# Patient Record
Sex: Male | Born: 1935 | Race: White | Hispanic: No | Marital: Married | Smoking: Never smoker
Health system: Southern US, Community
[De-identification: ages and names within clinical notes are randomized; demographics above are authoritative.]

## PROBLEM LIST (undated history)

## (undated) DIAGNOSIS — E538 Deficiency of other specified B group vitamins: Secondary | ICD-10-CM

## (undated) DIAGNOSIS — R5381 Other malaise: Secondary | ICD-10-CM

## (undated) DIAGNOSIS — M1990 Unspecified osteoarthritis, unspecified site: Secondary | ICD-10-CM

## (undated) DIAGNOSIS — K222 Esophageal obstruction: Secondary | ICD-10-CM

## (undated) DIAGNOSIS — K469 Unspecified abdominal hernia without obstruction or gangrene: Secondary | ICD-10-CM

## (undated) DIAGNOSIS — I6529 Occlusion and stenosis of unspecified carotid artery: Secondary | ICD-10-CM

## (undated) DIAGNOSIS — R6889 Other general symptoms and signs: Secondary | ICD-10-CM

## (undated) DIAGNOSIS — D649 Anemia, unspecified: Secondary | ICD-10-CM

## (undated) DIAGNOSIS — I10 Essential (primary) hypertension: Secondary | ICD-10-CM

## (undated) DIAGNOSIS — C61 Malignant neoplasm of prostate: Secondary | ICD-10-CM

## (undated) DIAGNOSIS — R42 Dizziness and giddiness: Secondary | ICD-10-CM

## (undated) DIAGNOSIS — R001 Bradycardia, unspecified: Secondary | ICD-10-CM

## (undated) DIAGNOSIS — K5790 Diverticulosis of intestine, part unspecified, without perforation or abscess without bleeding: Secondary | ICD-10-CM

## (undated) DIAGNOSIS — K279 Peptic ulcer, site unspecified, unspecified as acute or chronic, without hemorrhage or perforation: Secondary | ICD-10-CM

## (undated) DIAGNOSIS — R5383 Other fatigue: Secondary | ICD-10-CM

## (undated) HISTORY — DX: Esophageal obstruction: K22.2

## (undated) HISTORY — DX: Unspecified osteoarthritis, unspecified site: M19.90

## (undated) HISTORY — DX: Peptic ulcer, site unspecified, unspecified as acute or chronic, without hemorrhage or perforation: K27.9

## (undated) HISTORY — DX: Other general symptoms and signs: R68.89

## (undated) HISTORY — DX: Unspecified abdominal hernia without obstruction or gangrene: K46.9

## (undated) HISTORY — DX: Bradycardia, unspecified: R00.1

## (undated) HISTORY — DX: Diverticulosis of intestine, part unspecified, without perforation or abscess without bleeding: K57.90

## (undated) HISTORY — PX: SKIN SURGERY: SHX2413

## (undated) HISTORY — DX: Occlusion and stenosis of unspecified carotid artery: I65.29

## (undated) HISTORY — DX: Anemia, unspecified: D64.9

## (undated) HISTORY — DX: Deficiency of other specified B group vitamins: E53.8

## (undated) HISTORY — DX: Dizziness and giddiness: R42

## (undated) HISTORY — DX: Essential (primary) hypertension: I10

## (undated) HISTORY — DX: Malignant neoplasm of prostate: C61

## (undated) HISTORY — DX: Other fatigue: R53.83

## (undated) HISTORY — DX: Other malaise: R53.81

---

## 1998-07-17 ENCOUNTER — Encounter: Payer: Self-pay | Admitting: Gastroenterology

## 1998-07-17 ENCOUNTER — Ambulatory Visit (HOSPITAL_COMMUNITY): Admission: RE | Admit: 1998-07-17 | Discharge: 1998-07-17 | Payer: Self-pay | Admitting: Gastroenterology

## 1998-09-03 ENCOUNTER — Encounter (INDEPENDENT_AMBULATORY_CARE_PROVIDER_SITE_OTHER): Payer: Self-pay

## 1998-09-03 ENCOUNTER — Other Ambulatory Visit: Admission: RE | Admit: 1998-09-03 | Discharge: 1998-09-03 | Payer: Self-pay | Admitting: Gastroenterology

## 2001-04-12 ENCOUNTER — Inpatient Hospital Stay (HOSPITAL_COMMUNITY): Admission: EM | Admit: 2001-04-12 | Discharge: 2001-04-13 | Payer: Self-pay | Admitting: Emergency Medicine

## 2001-04-13 ENCOUNTER — Encounter: Payer: Self-pay | Admitting: Cardiology

## 2001-07-06 ENCOUNTER — Encounter: Payer: Self-pay | Admitting: Physical Medicine and Rehabilitation

## 2001-07-06 ENCOUNTER — Ambulatory Visit (HOSPITAL_COMMUNITY)
Admission: RE | Admit: 2001-07-06 | Discharge: 2001-07-06 | Payer: Self-pay | Admitting: Physical Medicine and Rehabilitation

## 2002-12-18 ENCOUNTER — Ambulatory Visit (HOSPITAL_COMMUNITY): Admission: RE | Admit: 2002-12-18 | Discharge: 2002-12-18 | Payer: Self-pay | Admitting: Urology

## 2003-01-08 ENCOUNTER — Ambulatory Visit: Admission: RE | Admit: 2003-01-08 | Discharge: 2003-02-01 | Payer: Self-pay | Admitting: Radiation Oncology

## 2003-02-10 HISTORY — PX: RETROPUBIC PROSTATECTOMY: SUR1055

## 2003-02-27 ENCOUNTER — Encounter (INDEPENDENT_AMBULATORY_CARE_PROVIDER_SITE_OTHER): Payer: Self-pay | Admitting: Specialist

## 2003-02-27 ENCOUNTER — Inpatient Hospital Stay (HOSPITAL_COMMUNITY): Admission: RE | Admit: 2003-02-27 | Discharge: 2003-03-03 | Payer: Self-pay | Admitting: Urology

## 2003-12-27 ENCOUNTER — Encounter: Admission: RE | Admit: 2003-12-27 | Discharge: 2004-02-09 | Payer: Self-pay | Admitting: Urology

## 2004-01-14 ENCOUNTER — Ambulatory Visit: Payer: Self-pay | Admitting: Family Medicine

## 2004-02-10 ENCOUNTER — Encounter: Admission: RE | Admit: 2004-02-10 | Discharge: 2004-05-10 | Payer: Self-pay | Admitting: Urology

## 2004-03-03 ENCOUNTER — Ambulatory Visit: Payer: Self-pay | Admitting: Family Medicine

## 2004-03-26 ENCOUNTER — Ambulatory Visit: Payer: Self-pay | Admitting: Family Medicine

## 2004-09-18 ENCOUNTER — Ambulatory Visit: Payer: Self-pay | Admitting: Family Medicine

## 2004-12-17 ENCOUNTER — Ambulatory Visit: Payer: Self-pay | Admitting: Family Medicine

## 2005-02-11 ENCOUNTER — Ambulatory Visit: Payer: Self-pay | Admitting: Family Medicine

## 2005-03-09 ENCOUNTER — Ambulatory Visit: Payer: Self-pay | Admitting: Family Medicine

## 2005-03-11 ENCOUNTER — Ambulatory Visit: Payer: Self-pay | Admitting: Gastroenterology

## 2005-03-30 ENCOUNTER — Ambulatory Visit: Payer: Self-pay | Admitting: Family Medicine

## 2005-04-06 ENCOUNTER — Ambulatory Visit: Payer: Self-pay | Admitting: Gastroenterology

## 2005-04-07 ENCOUNTER — Ambulatory Visit: Payer: Self-pay | Admitting: Gastroenterology

## 2005-04-07 ENCOUNTER — Encounter: Payer: Self-pay | Admitting: Internal Medicine

## 2005-04-08 ENCOUNTER — Ambulatory Visit: Payer: Self-pay | Admitting: Family Medicine

## 2005-06-10 ENCOUNTER — Ambulatory Visit: Payer: Self-pay | Admitting: Gastroenterology

## 2005-06-26 ENCOUNTER — Ambulatory Visit: Payer: Self-pay | Admitting: Gastroenterology

## 2005-07-01 ENCOUNTER — Ambulatory Visit: Payer: Self-pay | Admitting: Internal Medicine

## 2005-07-15 ENCOUNTER — Ambulatory Visit: Payer: Self-pay

## 2005-11-11 ENCOUNTER — Ambulatory Visit (HOSPITAL_COMMUNITY): Admission: RE | Admit: 2005-11-11 | Discharge: 2005-11-11 | Payer: Self-pay | Admitting: Urology

## 2006-03-15 ENCOUNTER — Ambulatory Visit (HOSPITAL_COMMUNITY): Admission: RE | Admit: 2006-03-15 | Discharge: 2006-03-15 | Payer: Self-pay | Admitting: Urology

## 2006-12-17 ENCOUNTER — Ambulatory Visit: Payer: Self-pay | Admitting: Internal Medicine

## 2006-12-31 ENCOUNTER — Ambulatory Visit: Payer: Self-pay | Admitting: Internal Medicine

## 2007-08-17 ENCOUNTER — Ambulatory Visit: Payer: Self-pay | Admitting: Internal Medicine

## 2007-08-30 ENCOUNTER — Encounter: Payer: Self-pay | Admitting: Internal Medicine

## 2007-08-30 ENCOUNTER — Ambulatory Visit: Payer: Self-pay

## 2007-09-09 ENCOUNTER — Ambulatory Visit: Payer: Self-pay | Admitting: Internal Medicine

## 2007-10-12 ENCOUNTER — Ambulatory Visit: Payer: Self-pay

## 2007-10-12 ENCOUNTER — Ambulatory Visit: Payer: Self-pay | Admitting: Internal Medicine

## 2007-11-24 ENCOUNTER — Ambulatory Visit: Payer: Self-pay | Admitting: Internal Medicine

## 2007-12-12 ENCOUNTER — Ambulatory Visit (HOSPITAL_COMMUNITY): Admission: RE | Admit: 2007-12-12 | Discharge: 2007-12-12 | Payer: Self-pay | Admitting: Internal Medicine

## 2007-12-12 ENCOUNTER — Ambulatory Visit: Payer: Self-pay | Admitting: Internal Medicine

## 2007-12-28 ENCOUNTER — Ambulatory Visit: Payer: Self-pay | Admitting: Internal Medicine

## 2008-06-25 ENCOUNTER — Encounter: Payer: Self-pay | Admitting: Internal Medicine

## 2008-06-25 ENCOUNTER — Telehealth (INDEPENDENT_AMBULATORY_CARE_PROVIDER_SITE_OTHER): Payer: Self-pay | Admitting: *Deleted

## 2008-07-17 ENCOUNTER — Telehealth: Payer: Self-pay | Admitting: Internal Medicine

## 2008-07-19 ENCOUNTER — Ambulatory Visit: Payer: Self-pay | Admitting: Internal Medicine

## 2008-07-19 DIAGNOSIS — I495 Sick sinus syndrome: Secondary | ICD-10-CM

## 2008-07-19 DIAGNOSIS — F419 Anxiety disorder, unspecified: Secondary | ICD-10-CM

## 2008-07-19 DIAGNOSIS — R5381 Other malaise: Secondary | ICD-10-CM

## 2008-07-19 DIAGNOSIS — Z8546 Personal history of malignant neoplasm of prostate: Secondary | ICD-10-CM

## 2008-07-19 HISTORY — DX: Other malaise: R53.81

## 2008-07-24 ENCOUNTER — Ambulatory Visit (HOSPITAL_COMMUNITY): Admission: RE | Admit: 2008-07-24 | Discharge: 2008-07-24 | Payer: Self-pay | Admitting: Internal Medicine

## 2008-07-25 ENCOUNTER — Telehealth (INDEPENDENT_AMBULATORY_CARE_PROVIDER_SITE_OTHER): Payer: Self-pay | Admitting: *Deleted

## 2009-01-08 ENCOUNTER — Ambulatory Visit: Payer: Self-pay | Admitting: Internal Medicine

## 2009-01-08 DIAGNOSIS — I10 Essential (primary) hypertension: Secondary | ICD-10-CM

## 2009-01-22 ENCOUNTER — Encounter: Payer: Self-pay | Admitting: Endocrinology

## 2009-01-22 LAB — CONVERTED CEMR LAB
Albumin: 4.1 g/dL
Basophils Relative: 0.1 %
CO2: 26 meq/L
Chloride: 105 meq/L
Creatinine, Ser: 1 mg/dL
Eosinophils Relative: 1.6 %
GFR calc Af Amer: >60 mL/min
GFR calc non Af Amer: >60 mL/min
Lymphocytes, automated: 22.4 %
Neutrophils Relative %: 72.2 %
Sodium: 137 meq/L
TSH: 0.85 microintl units/mL
Total Bilirubin: 0.4 mg/dL
WBC: 5.8 10*3/uL

## 2009-05-28 ENCOUNTER — Encounter: Payer: Self-pay | Admitting: Endocrinology

## 2009-05-28 LAB — CONVERTED CEMR LAB
AST: 20 units/L
Alkaline Phosphatase: 100 units/L
BUN: 29 mg/dL
Calcium: 9.3 mg/dL
Creatinine, Ser: 0.98 mg/dL
Eosinophils Relative: 2.6 %
Glucose, Bld: 73 mg/dL
Hgb A1c MFr Bld: 5.8 %
Neutrophils Relative %: 66.8 %
RBC: 4.4 M/uL
RDW: 10.4 %
Sodium: 142 meq/L

## 2009-07-01 ENCOUNTER — Encounter: Payer: Self-pay | Admitting: Internal Medicine

## 2009-07-25 ENCOUNTER — Ambulatory Visit: Payer: Self-pay | Admitting: Internal Medicine

## 2009-07-25 DIAGNOSIS — E785 Hyperlipidemia, unspecified: Secondary | ICD-10-CM | POA: Insufficient documentation

## 2009-08-28 ENCOUNTER — Telehealth: Payer: Self-pay | Admitting: Internal Medicine

## 2009-08-29 ENCOUNTER — Encounter: Payer: Self-pay | Admitting: Internal Medicine

## 2009-08-29 ENCOUNTER — Telehealth: Payer: Self-pay | Admitting: Internal Medicine

## 2009-09-05 ENCOUNTER — Encounter: Payer: Self-pay | Admitting: Internal Medicine

## 2009-10-01 ENCOUNTER — Encounter: Payer: Self-pay | Admitting: Endocrinology

## 2009-10-01 LAB — CONVERTED CEMR LAB
AST: 19 units/L
CO2: 25 meq/L
Calcium: 8.6 mg/dL
Chloride: 103 meq/L
Cholesterol: 202 mg/dL
HCT: 40.3 %
Hemoglobin: 13.8 g/dL
Platelets: 207 10*3/uL
Potassium: 4.5 meq/L
RBC: 4.42 M/uL
Sodium: 138 meq/L
TSH: 1.07 microintl units/mL
WBC: 5.68 10*3/uL

## 2009-10-07 ENCOUNTER — Encounter: Payer: Self-pay | Admitting: Internal Medicine

## 2009-10-10 ENCOUNTER — Ambulatory Visit: Payer: Self-pay | Admitting: Internal Medicine

## 2009-10-10 DIAGNOSIS — R079 Chest pain, unspecified: Secondary | ICD-10-CM

## 2009-10-15 ENCOUNTER — Telehealth (INDEPENDENT_AMBULATORY_CARE_PROVIDER_SITE_OTHER): Payer: Self-pay | Admitting: *Deleted

## 2009-10-16 ENCOUNTER — Ambulatory Visit: Payer: Self-pay | Admitting: Cardiovascular Disease

## 2009-10-16 ENCOUNTER — Encounter: Payer: Self-pay | Admitting: Cardiovascular Disease

## 2009-10-16 ENCOUNTER — Ambulatory Visit: Payer: Self-pay

## 2009-10-16 ENCOUNTER — Encounter (HOSPITAL_COMMUNITY): Admission: RE | Admit: 2009-10-16 | Discharge: 2009-12-10 | Payer: Self-pay | Admitting: Internal Medicine

## 2010-03-11 NOTE — Letter (Signed)
Summary: [facility] Medical Arrhythmia Clinic Note  [facility] Medical Arrhythmia Clinic Note   Imported By: Roderic Ovens 07/29/2009 14:38:55  _____________________________________________________________________  External Attachment:    Type:   Image     Comment:   External Document

## 2010-03-11 NOTE — Assessment & Plan Note (Signed)
Summary: Cardiology Nuclear Testing  Nuclear Med Background Indications for Stress Test: Evaluation for Ischemia   History: Echo, Myocardial Perfusion Study  History Comments: '09 [id number]  Symptoms: Chest Pain, Dizziness, Fatigue, Near Syncope, Palpitations, Rapid HR  Symptoms Comments: Last episode of UU:[id number].   Nuclear Pre-Procedure Cardiac Risk Factors: Family History - CAD, Hypertension, Lipids, NIDDM Caffeine/Decaff Intake: None NPO After: 11:00 PM Lungs: Clear.  O2 Sat 99% on RA. IV 0.9% NS with Angio Cath: 22g     IV Site: R Antecubital IV Started by: Bonnita Levan, RN Chest Size (in) 38     Height (in): 67 Weight (lb): 136 BMI: 21.38  Nuclear Med Study 1 or 2 day study:  1 day     Stress Test Type:  Eugenie Birks Reading MD:  Charlton Haws, MD     Referring MD:  Sherryl Manges, MD Resting Radionuclide:  Technetium 9m Tetrofosmin     Resting Radionuclide Dose:  11.0 mCi  Stress Radionuclide:  Technetium 23m Tetrofosmin     Stress Radionuclide Dose:  32.6 mCi   Stress Protocol      Predicted Max HR:  144 bpm     Stress Test Technologist:  Rea College, CMA-N     Nuclear Technologist:  Domenic Polite, CNMT  Rest Procedure  Myocardial perfusion imaging was performed at rest 45 minutes following the intravenous administration of Technetium 65m Tetrofosmin.  Stress Procedure  The patient received IV Lexiscan 0.4 mg over 15-seconds.  Technetium 73m Tetrofosmin injected at 30-seconds.  There was an accelerated junctional rhythm immediately post infusion.  Quantitative spect images were obtained after a 45 minute delay.  QPS Raw Data Images:  Normal; no motion artifact; normal heart/lung ratio. Stress Images:  Normal homogeneous uptake in all areas of the myocardium. Rest Images:  Normal homogeneous uptake in all areas of the myocardium. Subtraction (SDS):  SDS 4 Transient Ischemic Dilatation:  1.14  (Normal <1.22)  Lung/Heart Ratio:  0.28  (Normal  <0.45)  Quantitative Gated Spect Images QGS EDV:  74 ml QGS ESV:  32 ml QGS EF:  57 % QGS cine images:  normal  Findings Normal nuclear study      Overall Impression  Exercise Capacity: Lexiscan with no exercise. BP Response: Normal blood pressure response. Clinical Symptoms: Dysnpnea ECG Impression: No significant ST segment change suggestive of ischemia. Overall Impression: Normal stress nuclear study.  Appended Document: Cardiology Nuclear Testing LMTCB./CY    Appended Document: Cardiology Nuclear Testing pt aware./cy

## 2010-03-11 NOTE — Assessment & Plan Note (Signed)
Summary: problem with bp meds/mt   CC:  problem with bp meds.  Pt stil feeling very low in energy.  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     The patient has elected not to do anything, in terms of pacing  comes in for routine followup today. He has a multitude of complaints including back pain and neck pain and chest pain and fatigue. It is partly his impression that this is related to his blood pressure medications which are recently initiated. He tried an extremity decreased in half and thought that his symptoms improved somewhat.  he brings with him laboratories. His total cholesterol was 202 his LDL is 111 hemogram was normal the blood profile was normal apart from mildly elevated BUN his ABI was normal his aortic abdominal scan was normal his carotid artery findings were apparently also normal    Current Medications (verified): 1)  Aspirin 81 Mg Tbec (Aspirin) .... Take One Tablet By Mouth Daily 2)  Otrivin .... Nasal Spray As Needed-Bid 3)  Alprazolam 0.25 Mg Tabs (Alprazolam) .... Take One Tablet Two Times A Day 4)  Tekturna 150 Mg Tabs (Aliskiren Fumarate) .... Once Daily 5)  Fish Oil 1000 Mg Caps (Omega-3 Fatty Acids) .... Take One Capsule Two Times A Day 6)  Coq-10 100 Mg Caps (Coenzyme Q10) .... Once Daily 7)  Cyanocobalamin 1000 Mcg/ml Soln (Cyanocobalamin) .... One Injection Monthly 8)  Prilosec 20 Mg Cpdr (Omeprazole) .... Take One Capsule Every Morning Befor Breakfast 9)  Metamucil 0.52 Gm Caps (Psyllium) .... Take 5 Capsules Daily  Allergies (verified): 1)  Sulfamethoxazole (Sulfamethoxazole)  Past History:  Past Medical History: Last updated: 07/19/2008 Angina phlebitis Bradycardia Exercise Intolerance  Past Surgical History: Last updated: 07/19/2008  Radical retropubic  prostatectomy-2005  Family History: Last updated: 01/08/2009 Negative FH of Diabetes, Hypertension, or Coronary Artery Disease  Social History: Last updated: 07/19/2008 Married  Tobacco Use - No.  Alcohol Use - yes Drug Use - no  Vital Signs:  Patient profile:   77 year old male Height:      67 inches Weight:      135 pounds BMI:     21.22 Pulse rate:   58 / minute Pulse rhythm:   regular BP sitting:   120 / 60  (right arm) Cuff size:   regular  Vitals Entered By: Judithe Modest CMA (October 10, 2009 9:55 AM)  Physical Exam  General:  /The patient was alert and oriented in no acute distress. HEENT Normal.  Neck veins were flat, carotids were brisk.  Lungs were clear.  Heart sounds were slow but  regular without murmurs or gallops.  Abdomen was soft with active bowel sounds. There is no clubbing cyanosis or edema. Skin Warm and dry    EKG  Procedure date:  07/22/2009  Findings:      sinus rhythm at 48 Intervals 0.16 5.08/0.40  Impression & Recommendations:  Problem # 1:  SINUS BRADYCARDIA (ICD-427.81) the patient continues to have sinus bradycardia. He also has fatigue which may be attributable.  He also wonders whether his fatigue and other symptoms are related to his blood pressure medications. We will plan to discontinue the blood pressmedications and see how his symptoms fare. In the event that they persist, he is willing to consider pacemaker implantation His updated medication list for this problem includes:  Aspirin 81 Mg Tbec (Aspirin) .Marland Kitchen... Take one tablet by mouth daily  Problem # 2:  HYPERTENSION, BENIGN (ICD-401.1) plan to stop his tekturna for his blood pressure. Hopefully we will see his symptoms are attributable to this medication or not His updated medication list for this problem includes:    Aspirin 81 Mg Tbec (Aspirin) .Marland Kitchen... Take one tablet by mouth daily    Tekturna 150 Mg Tabs (Aliskiren fumarate) ..... Once daily  Problem # 3:  CHEST  PAIN (ICD-786.50)  the patient has chest pain which is somewhat atypical. He has not had Myoview scan in the past. He has had stress testing with which he is chronically incontinent. Will plan to undertake Myoview scanning. His updated medication list for this problem includes:    Aspirin 81 Mg Tbec (Aspirin) .Marland Kitchen... Take one tablet by mouth daily  Orders: Nuclear Stress Test (Nuc Stress Test)  Patient Instructions: 1)  Your physician recommends that you schedule a follow-up appointment in: YEAR WITH DR Graciela Husbands 2)  Your physician has recommended you make the following change in your medication: STOP TEKTURNA 3)  Your physician has requested that you have an adenosine myoview.  For further information please visit [email or web address]  Please follow instruction sheet, as given.

## 2010-03-11 NOTE — Progress Notes (Signed)
Summary: nuc pre procedure  Phone Note Outgoing Call Call back at Home Phone [phone]   Call placed by: Darrick Penna Call placed to: Patient Reason for Call: Confirm/change Appt Summary of Call: Reviewed information on Myoview Information Sheet (see scanned document for further details). No answer at phone #'s listed.      Nuclear Med Background Indications for Stress Test: Evaluation for Ischemia   History: Echo  History Comments: 09 Echo nl 09 MPS nl  Symptoms: Chest Pain, Fatigue    Nuclear Pre-Procedure Cardiac Risk Factors: Family History - CAD, Hypertension, Lipids Height (in): 67

## 2010-03-11 NOTE — Progress Notes (Signed)
Summary: REd Blood in stools  Phone Note Call from Patient   Caller: Scarlette Calico [phone] Call For: Dr Leone Payor Summary of Call: Just now- when to bathroom and saw red blood with his stools Initial call taken by: Leanor Kail [facility],  August 28, 2009 12:38 PM  Follow-up for Phone Call        patient went to his primary care, they are not sure of the diagnosis.  Patient  is scheduled to see Dr Meisinheimer tomorrow scheduled with his primary care.  She thanked me for the call. Follow-up by: Darcey Nora RN, CGRN,  August 28, 2009 2:32 PM

## 2010-03-11 NOTE — Assessment & Plan Note (Signed)
Summary: 6 month rov./sl   CC:  6 month follow up. Pt reports doing not so good.  He states he has felt burned out and weak.  He is now pre-diabetic and is on a special diet.  Marland Kitchen  History of Present Illness: Alan [name] is seen in followup for peripheral incompetence. Since we last saw him last fall, he has again seen Alan [name] whose initial impression was in agreement with the diagnosis of chronotropic incompetence.  We elected not to do anything, and he saw  Alan [name] at  [facility] again last week and apparently felt that at this juncture we should do nothing unless he had specific associated symptoms.  The patient admits to being depressed and has refused to take antidepressants. He is recently been diagnosed with diabetes and high blood pressure. He has been disinclined to take medications for any of this.   Is a family history of abdominal aortic aneurysm; he has been following this on his own in the [employer]   Current Medications (verified): 1)  Aspirin 81 Mg Tbec (Aspirin) .... Take One Tablet By Mouth Daily 2)  Otrivin .... Nasal Spray As Needed 3)  Alprazolam 0.5 Mg Tabs (Alprazolam) .... Take One Tablet Two Times A Day 4)  Tekturna 150 Mg Tabs (Aliskiren Fumarate) .... Once Daily 5)  Fish Oil 1000 Mg Caps (Omega-3 Fatty Acids) .... Once Daily 6)  Coq-10 100 Mg Caps (Coenzyme Q10) .... Once Daily  Allergies (verified): 1)  Sulfamethoxazole (Sulfamethoxazole)  Past History:  Past Medical History: Last updated: 07/19/2008 Angina phlebitis Bradycardia Exercise Intolerance  Past Surgical History: Last updated: 07/19/2008  Radical retropubic prostatectomy-2005  Family History: Last updated: 01/08/2009 Negative FH of Diabetes, Hypertension, or Coronary Artery Disease  Social History: Last updated: 07/19/2008 Married  Tobacco Use - No.  Alcohol Use - yes Drug Use - no  Vital Signs:  Patient profile:   77 year old male Height:      67 inches Weight:       135 pounds BMI:     21.22 Pulse rate:   58 / minute Pulse rhythm:   regular BP sitting:   118 / 72  (left arm) Cuff size:   regular  Vitals Entered By: Judithe Modest CMA (July 25, 2009 4:04 PM)  Physical Exam  General:  The patient was alert and oriented in no acute distress. HEENT Normal.  Neck veins were flat, carotids were brisk.  Lungs were clear.  Heart sounds were regular without murmurs or gallops.  Abdomen was soft with active bowel sounds. There is no clubbing cyanosis or edema. Skin Warm and dry    Impression & Recommendations:  Problem # 1:  SINUS BRADYCARDIA (ICD-427.81) I spoke with Alan [name] today and spent more than 50% of our 32 minute visit discussing issues related to chronotropic incompetence his measurements and its therapy. At this point the patient remains disinclined to pursue pacemaker therapy. He is not sure whether the depression is contributing more to his fatigue. He will plan to take his antidepressant and we will review it again in about 6 months time His updated medication list for this problem includes:    Aspirin 81 Mg Tbec (Aspirin) .Marland Kitchen... Take one tablet by mouth daily  Orders: EKG w/ Interpretation (93000)  Problem # 2:  HYPERLIPIDEMIA (ICD-272.4) the patient brought with him reports of his LDL 150 range over the last 10 years. He was recently started on Crestor. He has been reluctant to take this.  I suggested that he start at 2 times a week.  Patient Instructions: 1)  Your physician wants you to follow-up in:6 months with Alan [name].   You will receive a reminder letter in the mail two months in advance. If you don't receive a letter, please call our office to schedule the follow-up appointment.  Appended Document: [facility]     Allergies: 1)  Sulfamethoxazole (Sulfamethoxazole)   EKG  Procedure date:  07/25/2009  Findings:      sinus rhythm at 58 Intervals 0.12/0.08/0.38 Axis -12 Otherwise normal  Impression &  Recommendations:  Problem # 1:  SINUS BRADYCARDIA (ICD-427.81) I should note also that we reviewed Alan. Dwaine Deter office records His updated medication list for this problem includes:    Aspirin 81 Mg Tbec (Aspirin) .Marland Kitchen... Take one tablet by mouth daily

## 2010-03-11 NOTE — Progress Notes (Signed)
Summary: Bright red blood w/stool  Phone Note Call from Patient Call back at Home Phone [phone]   Caller: Alan [name]  Call For: Dr Leone Payor Summary of Call: Just had a bm and is bleeding like a period.  Initial call taken by: Leanor Kail [facility],  August 29, 2009 1:11 PM  Follow-up for Phone Call        I spoke with the wife, he has had no bleeding today.  She says the  [facility] misunderstood.  Patient was seen by Dr Meisinheimer in [place] today, they want to see Dr Leone Payor.  Meisinheimer recommended a colon.  No blood on exam today.  No bleeding today.  Dr Leone Payor are you willing to see patient? Follow-up by: Darcey Nora RN, CGRN,  August 29, 2009 1:55 PM  Additional Follow-up for Phone Call Additional follow up Details #1::        do we have records? - especially prior colonoscopies...will discuss also Iva Boop MD, [facility]  September 03, 2009 7:20 AM  I called and spoke with the patient's wife, they have just decided to stay with Dr Meisinheimer.   Additional Follow-up by: Darcey Nora RN, CGRN,  September 03, 2009 9:36 AM    Additional Follow-up for Phone Call Additional follow up Details #2::    ok Follow-up by: Iva Boop MD, [facility],  September 03, 2009 9:50 AM

## 2010-03-17 ENCOUNTER — Encounter: Payer: Self-pay | Admitting: Endocrinology

## 2010-03-17 LAB — CONVERTED CEMR LAB: Hgb A1c MFr Bld: 5.8 %

## 2010-03-18 ENCOUNTER — Telehealth: Payer: Self-pay | Admitting: Internal Medicine

## 2010-03-20 ENCOUNTER — Encounter: Payer: Self-pay | Admitting: Internal Medicine

## 2010-03-25 ENCOUNTER — Encounter: Payer: Self-pay | Admitting: Internal Medicine

## 2010-03-25 ENCOUNTER — Ambulatory Visit (INDEPENDENT_AMBULATORY_CARE_PROVIDER_SITE_OTHER): Payer: Medicare Other | Admitting: Internal Medicine

## 2010-03-25 ENCOUNTER — Telehealth: Payer: Self-pay | Admitting: Internal Medicine

## 2010-03-25 DIAGNOSIS — R1319 Other dysphagia: Secondary | ICD-10-CM | POA: Insufficient documentation

## 2010-03-25 DIAGNOSIS — R12 Heartburn: Secondary | ICD-10-CM | POA: Insufficient documentation

## 2010-03-25 DIAGNOSIS — K222 Esophageal obstruction: Secondary | ICD-10-CM | POA: Insufficient documentation

## 2010-03-26 DIAGNOSIS — Z860100 Personal history of colon polyps, unspecified: Secondary | ICD-10-CM | POA: Insufficient documentation

## 2010-03-27 NOTE — Progress Notes (Signed)
Summary: Appt sooner than first available  Phone Note From Other Clinic   Caller: Fannie Knee @ Dr Tomasa Blase [phone] Call For: Dr Leone Payor Reason for Call: Schedule Patient Appt Summary of Call: Would like pt seen sooner tahn first available for dysphagia. Former pt of Dr Doreatha Martin, then had a Colon by Dr Leone Payor 12-2006 has not had anything else since. Initial call taken by: Leanor Kail [facility],  March 18, 2010 9:33 AM  Follow-up for Phone Call        Patient transfered care to Dr Meisinheimer's office in July see phone notes dated 08/29/09.  Fannie Knee notified that patient needs to return to Dr Meisinheimer. Follow-up by: Darcey Nora RN, CGRN,  March 18, 2010 10:01 AM

## 2010-04-02 ENCOUNTER — Encounter: Payer: Self-pay | Admitting: Endocrinology

## 2010-04-02 NOTE — Letter (Signed)
Summary: EGD Instructions  [facility]  [address] [place], [place] [address]   Phone: [phone]  Fax: [phone]       Alan [name]    13-Oct-1931    MRN: [id number]       Procedure Day /Date:TUESDAY, 04/15/10     Arrival Time: 2:30 PM     Procedure Time:3:30 PM     Location of Procedure:                    [facility] (4th Floor)   PREPARATION FOR ENDOSCOPY   On TUESDAY, 04/15/10 THE DAY OF THE PROCEDURE:  1.   No solid foods, milk or milk products are allowed after midnight the night before your procedure.  2.   Do not drink anything colored red or purple.  Avoid juices with pulp.  No orange juice.  3.  You may drink clear liquids until1:30 PM, which is 2 hours before your procedure.                                                                                                CLEAR LIQUIDS INCLUDE: Water Jello Ice Popsicles Tea (sugar ok, no milk/cream) Powdered fruit flavored drinks Coffee (sugar ok, no milk/cream) Gatorade Juice: apple, white grape, white cranberry  Lemonade Clear bullion, consomm, broth Carbonated beverages (any kind) Strained chicken noodle soup Hard Candy   MEDICATION INSTRUCTIONS  Unless otherwise instructed, you should take regular prescription medications with a small sip of water as early as possible the morning of your procedure.         OTHER INSTRUCTIONS  You will need a responsible adult at least 75 years of age to accompany you and drive you home.   This person must remain in the waiting room during your procedure.  Wear loose fitting clothing that is easily removed.  Leave jewelry and other valuables at home.  However, you may wish to bring a book to read or an iPod/MP3 player to listen to music as you wait for your procedure to start.  Remove all body piercing jewelry and leave at home.  Total time from sign-in until discharge is approximately 2-3 hours.  You should go home directly  after your procedure and rest.  You can resume normal activities the day after your procedure.  The day of your procedure you should not:   Drive   Make legal decisions   Operate machinery   Drink alcohol   Return to work  You will receive specific instructions about eating, activities and medications before you leave.    The above instructions have been reviewed and explained to me by   _______________________    I fully understand and can verbalize these instructions _____________________________ Date _________

## 2010-04-02 NOTE — Progress Notes (Signed)
Summary: needs appt for dysphagia  Phone Note Outgoing Call   Summary of Call: Please call the patient and let him know I received his letter and will be happy to see him as a patient. Please see if I can see him this week or next week and also obtain hispaper chart for me. Iva Boop MD, [facility]  March 25, 2010 7:52 AM   Follow-up for Phone Call        Patient will see Dr Leone Payor today at 3:15 per Dr Leone Payor Follow-up by: Graciella Freer RN,  March 25, 2010 9:45 AM      Colonoscopy  Procedure date:  09/05/2009  Findings:      Mild diverticulosis left colon Internal Hemorrhoids  Dr. Jennye Boroughs

## 2010-04-02 NOTE — Procedures (Signed)
Summary: EGD   EGD  Procedure date:  04/07/2005  Findings:      Location: [facility]  Hiatal Hernia  Patient Name: Alan [name], Alan [name] MRN:  Procedure Procedures: Panendoscopy (EGD) CPT: 43235.  Personnel: Endoscopist: Ulyess Mort, MD.  Referred By: Lise Auer, MD.  Exam Location: Exam performed in Outpatient Clinic. Outpatient  Patient Consent: Procedure, Alternatives, Risks and Benefits discussed, consent obtained, from patient. Consent was obtained by the RN.  Indications Symptoms: Dysphagia. Dyspepsia, Reflux symptoms  History  Current Medications: Patient is not currently taking Coumadin.  Pre-Exam Physical: Performed Sep 30, 2001  Entire physical exam was normal. Abdominal exam, Extremity exam, Mental status exam WNL.  Comments: Pt. history reviewed/updated, physical exam performed prior to initiation of sedation? Exam Exam Info: Maximum depth of insertion Duodenum, intended Duodenum. Patient position: on left side. Vocal cords visualized. Gastric retroflexion performed. Images taken. ASA Classification: II. Tolerance: good.  Sedation Meds: Patient assessed and found to be appropriate for moderate (conscious) sedation. Fentanyl given IV. Versed given IV. Cetacaine Spray given aerosolized.  Monitoring: BP and pulse monitoring done. Oximetry used. Supplemental O2 given  Findings - HIATAL HERNIA: 3 cms. in length. early stricture with increased spasm of esopagous   -- decreased ues. ICD9: Hernia, Hiatal: 553.3. - Dilation: Fundus. Maloney dilator used, Diameter: 54 mm, Minimal Resistance, No Heme present on extraction. Patient tolerance good.  - Normal: Fundus to Jejunum.   Assessment  Diagnoses: 553.3: Hernia, Hiatal.   Events  Unplanned Intervention: No unplanned interventions were required.  Unplanned Events: There were no complications. Plans Disposition: After procedure patient sent to recovery. After recovery patient  sent home.    This report was created from the original endoscopy report, which was reviewed and signed by the above listed endoscopist.

## 2010-04-02 NOTE — Assessment & Plan Note (Signed)
Summary: Triage Dysphagia   History of Present Illness Visit Type: Follow-up Visit Primary GI MD: Stan Head MD [facility] Primary Provider: Laverna Peace Requesting Provider: n/a Chief Complaint: Dysphagia and heartburn for a couple of months, uses Zantac and Prilosec and does not help. Pt states when he chews sugarless gum it help releave his symptoms History of Present Illness:   77 yo wm with known GERD and previous esophageal dilations for early stricture and spasm (2/07 - Dr. Corinda Gubler. He describes increasing indigestion and food moves slowly down his throat. He has tried Prilosec and Zantac without benefit. He has probles for at least 2-3 months. The food will stop or hang his chest at times. Pills also at times. Peanut butter on toast will stop. He usually waits and things pass. Pills are a problem aout every other day Food a problem about every 2-3 days. Chewing gum helps with initiating belching and relief of gas pressure. He will do this at night sometimes. he is chronically anxious he says and admts that could be related. Weight down 10 # since 2010. He has been eating differently (prediabetic) but says he lost prior to staring that.  Wife here and contributes to history.   GI Review of Systems    Reports acid reflux, chest pain, heartburn, and  weight loss.      Denies abdominal pain, belching, bloating, dysphagia with liquids, dysphagia with solids, loss of appetite, nausea, vomiting, vomiting blood, and  weight gain.        Denies anal fissure, black tarry stools, change in bowel habit, constipation, diarrhea, diverticulosis, fecal incontinence, heme positive stool, hemorrhoids, irritable bowel syndrome, jaundice, light color stool, liver problems, rectal bleeding, and  rectal pain. Preventive Screening-Counseling & Management      Drug Use:  no.      Current Medications (verified): 1)  Aspirin 81 Mg Tbec (Aspirin) .... Take One Tablet By Mouth Daily 2)  Otrivin ....  Nasal Spray As Needed-Bid 3)  Alprazolam 0.25 Mg Tabs (Alprazolam) .... Take One Tablet Three  Times A Day 4)  Fish Oil 1000 Mg Caps (Omega-3 Fatty Acids) .... Take One Capsule Two Times A Day 5)  Coq-10 100 Mg Caps (Coenzyme Q10) .Marland Kitchen.. 1 By Mouth Two Times A Day 6)  Cyanocobalamin 1000 Mcg/ml Soln (Cyanocobalamin) .... One Injection Monthly 7)  Prilosec 20 Mg Cpdr (Omeprazole) .... Take One Capsule Every Morning Befor Breakfast 8)  Metamucil 0.52 Gm Caps (Psyllium) .... Take 5 Capsules Daily As Needed 9)  B-100 Complex  Tabs (Vitamins-Lipotropics) .Marland Kitchen.. 1 By Mouth Once Daily 10)  Zantac 150 Mg Tabs (Ranitidine Hcl) .Marland Kitchen.. 1 By Mouth Once Daily  Allergies (verified): 1)  ! 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  Alcohol Use - no Drug Use - no Illicit Drug Use - no  Review of Systems       The patient complains of allergy/sinus.         All other ROS negative except as per HPI.   Vital  Signs:  Patient profile:   77 year old male Height:      67 inches Weight:      132 pounds BMI:     20.75 BSA:     1.70 Pulse rate:   64 / minute Pulse rhythm:   regular BP sitting:   104 / 58  (left arm)  Vitals Entered By: Merri Ray CMA Duncan Dull) (March 25, 2010 3:22 PM)  Physical Exam  General:  thin NAD Eyes:  anicteric Mouth:  clear Neck:   Supple; mildly enlarged left submandibular salivary gland. Lungs:  Clear throughout to auscultation. Heart:  Regular rate and rhythm; no murmurs, rubs,  or bruits. Abdomen:  Soft, nontender and nondistended. No masses, hepatosplenomegaly or hernias noted. Normal bowel sounds. Cervical Nodes:  No significant cervical or supraclavicular adenopathy.  Psych:  mildly anxious   Impression & Recommendations:  Problem # 1:  OTHER DYSPHAGIA (ICD-787.29) Assessment Deteriorated Has had an "early stricture" descrinbed and spasm. Responded well and long time to [address] [employer] in 2007 so plan to reattempt that. ? component of anxiety/functional disturbance Risks, benefits,and indications of endoscopic procedure(s) were reviewed with the patient and all questions answered.  Orders: EGD SAV (EGD SAV)  Problem # 2:  ESOPHAGEAL STRICTURE (ICD-530.3) Assessment: Deteriorated  Orders: EGD SAV (EGD SAV)  Problem # 3:  HEARTBURN (ICD-787.1) Assessment: Deteriorated ? GERD ? functional heartburn he is anxious Nexium samples provided Orders: EGD SAV (EGD SAV)  Problem # 4:  TUBULOVILLOUS ADENOMA, COLON, HX OF (ICD-V12.72) Assessment: Comment Only 7 mm TV adenoma in 2000 no pollyps 2003, 2008, 2011  I would not place a routine recall given his age (next routine colonoscopy if done would be age 64 and in 2016  Patient Instructions: 1)  EGD with [facility]. 04/15/10 3:30 pm arrive at 2:30 pm on the 4th floor. 2)  Upper Endoscopy brochure given.  3)  Nexium samples given to you to take 1 by mouth 30 minutes prior to breakfast or first meal.   4)  Copy sent to : Doug Schultz,MD 5)  The medication list was reviewed and reconciled.  All changed / newly prescribed medications were explained.  A complete medication list was provided to the patient / caregiver.

## 2010-04-02 NOTE — Procedures (Signed)
Summary: colonoscopy   Colonoscopy  Procedure date:  12/31/2006  Findings:      Location:  [facility].  Results: Hemorrhoids.       Patient Name: Alan [name], Alan [name] MRN:  Procedure Procedures: Colorectal cancer screening, high risk CPT: G0105.  Personnel: Endoscopist: Iva Boop, MD, [facility].  Exam Location: Exam performed in Outpatient Clinic. Outpatient  Patient Consent: Procedure, Alternatives, Risks and Benefits discussed, consent obtained, from patient. Consent was obtained by the RN.  Indications  Surveillance of: Adenomatous Polyp(s). This is an initial surveillance exam. Initial polypectomy was performed in 2000. in Jul. 1-2 Polyps were found at Index Exam. Largest polyp removed was 6 to 9 mm. Prior polyp located in proximal (splenic flexure and beyond) colon. Pathology of worst  polyp: tubulovillous adenoma. Previous surveillance exam(s) in  2003,  History  Current Medications: Patient is not currently taking Coumadin.  Allergies: Allergic to sulfa.  Pre-Exam Physical: Performed Dec 31, 2006. Cardio-pulmonary exam, Rectal exam, HEENT exam , Abdominal exam, Mental status exam WNL.  Comments: Pt. history reviewed/updated, physical exam performed prior to initiation of sedation? YES Prostate is absent Exam Exam: Extent of exam reached: Cecum, extent intended: Cecum.  The cecum was identified by appendiceal orifice and IC valve. Patient position: on left side. Time to Cecum: 00:03:02. Time for Withdrawl: 00:09:08. Colon retroflexion performed. Images taken. ASA Classification: II. Tolerance: excellent.  Monitoring: Pulse and BP monitoring, Oximetry used. Supplemental O2 given.  Colon Prep Used MiraLax for colon prep. Prep results: excellent.  Sedation Meds: Patient assessed and found to be appropriate for moderate (conscious) sedation. Fentanyl 50 mcg. given IV. Versed 5 mg. given IV.  Findings - NORMAL EXAM: Cecum to Sigmoid Colon.    HEMORRHOIDS: Internal and External. Size: Grade I.    Comments: Diverticulosis not seen today  Assessment  Comments: 1) NO POLYPS OR CANCER SEEN 2) INT/EXT HEMORRHOIDS 3) OTHERWISE NORMAL 4) EXCELLENT PREP Events  Unplanned Interventions: No intervention was required.  Plans Patient Education: Patient given standard instructions for: Hemorrhoids.  Disposition: After procedure patient sent to recovery. After recovery patient sent home.  Scheduling/Referral: Follow-Up prn.  Comments: Consider repeat colonoscopy in 5 yrs depending upon health status. Has not had any polyps over 8 yrs since initial and only polypectomy.  This report was created from the original endoscopy report, which was reviewed and signed by the above listed endoscopist.   cc:  Lise Auer, MD      The Patient

## 2010-04-04 ENCOUNTER — Ambulatory Visit (INDEPENDENT_AMBULATORY_CARE_PROVIDER_SITE_OTHER): Payer: Medicare Other | Admitting: Endocrinology

## 2010-04-04 ENCOUNTER — Telehealth: Payer: Self-pay | Admitting: Endocrinology

## 2010-04-04 ENCOUNTER — Encounter: Payer: Self-pay | Admitting: Endocrinology

## 2010-04-04 DIAGNOSIS — R7309 Other abnormal glucose: Secondary | ICD-10-CM

## 2010-04-04 DIAGNOSIS — L719 Rosacea, unspecified: Secondary | ICD-10-CM | POA: Insufficient documentation

## 2010-04-08 NOTE — Procedures (Signed)
Summary: Colonoscopy/[facility]  Colonoscopy/[facility]   Imported By: Sherian Rein 03/31/2010 10:20:14  _____________________________________________________________________  External Attachment:    Type:   Image     Comment:   External Document

## 2010-04-08 NOTE — Letter (Signed)
Summary: Laurell Roof MD/[facility]  Laurell Roof MD/[facility]   Imported By: Sherian Rein 03/31/2010 10:15:30  _____________________________________________________________________  External Attachment:    Type:   Image     Comment:   External Document

## 2010-04-08 NOTE — Letter (Signed)
Summary: Laurell Roof MD/[facility]  Laurell Roof MD/[facility]   Imported By: Sherian Rein 03/31/2010 10:17:08  _____________________________________________________________________  External Attachment:    Type:   Image     Comment:   External Document

## 2010-04-08 NOTE — Letter (Signed)
Summary: Letter from patient  Letter from patient   Imported By: Sherian Rein 03/31/2010 10:18:48  _____________________________________________________________________  External Attachment:    Type:   Image     Comment:   External Document

## 2010-04-08 NOTE — Progress Notes (Signed)
Summary: ?diet  Phone Note Call from Patient   Caller: Patient Summary of Call: Pt states that he has been following a strict modified carbohydrate diet from a dietician for over a year and wants to know he can deviate a little from it from time to time. Initial call taken by: Brenton Grills CMA Duncan Dull),  April 04, 2010 11:22 AM  Follow-up for Phone Call        your weight is good, so the answer is yes Follow-up by: Minus Breeding MD,  April 04, 2010 11:28 AM  Additional Follow-up for Phone Call Additional follow up Details #1::        left message for pt to callback office Additional Follow-up by: Brenton Grills CMA Duncan Dull),  April 04, 2010 1:07 PM    Additional Follow-up for Phone Call Additional follow up Details #2::    pt informed Follow-up by: Brenton Grills CMA Duncan Dull),  April 04, 2010 2:59 PM

## 2010-04-08 NOTE — Assessment & Plan Note (Signed)
Summary: NEW ENDO / DM / MEDICARE / [employer] /DR DOUGLAS SHULTZ / /[phone]...   Vital Signs:  Patient profile:   77 year old male Height:      67 inches (170.18 cm) Weight:      134 pounds (60.91 kg) BMI:     21.06 O2 Sat:      97 % on Room air Temp:     98.2 degrees F (36.78 degrees C) oral Pulse rate:   53 / minute Pulse rhythm:   regular BP sitting:   108 / 60  (left arm) Cuff size:   regular  Vitals Entered By: Brenton Grills CMA Duncan Dull) (April 04, 2010 10:05 AM)  O2 Flow:  Room air CC: New Endo Consult/DM/Dr. Riley Lam Schultz/aj Is Patient Diabetic? Yes   Referring Provider:  Foye Deer MD Primary Provider:  Laverna Peace  CC:  New Endo Consult/DM/Dr. Riley Lam Schultz/aj.  History of Present Illness: over the past 15 months, a1c has varied between 5.8 and 6.0.  he has never been hospitalized,or been on medication for blood sugar.   symptomatically, pt stated moderate "indigestion," at the epigastric area, but no assoc n/v.  nexium does not help.  he say his diet is very good.    Current Medications (verified): 1)  Aspirin 81 Mg Tbec (Aspirin) .... Take One Tablet By Mouth Daily 2)  Otrivin .... Nasal Spray As Needed-Bid 3)  Alprazolam 0.25 Mg Tabs (Alprazolam) .... Take One Tablet Three  Times A Day 4)  Fish Oil 1000 Mg Caps (Omega-3 Fatty Acids) .... Take One Capsule Two Times A Day 5)  Coq-10 100 Mg Caps (Coenzyme Q10) .Marland Kitchen.. 1 By Mouth Two Times A Day 6)  Cyanocobalamin 1000 Mcg/ml Soln (Cyanocobalamin) .... One Injection Monthly 7)  Metamucil 0.52 Gm Caps (Psyllium) .... Take 5 Capsules Daily As Needed 8)  B-100 Complex  Tabs (Vitamins-Lipotropics) .Marland Kitchen.. 1 By Mouth Once Daily 9)  Nexium 40 Mg Cpdr (Esomeprazole Magnesium) .Marland Kitchen.. 1 Capsule By Mouth Once Daily  Allergies (verified): 1)  ! Pcn 2)  Sulfamethoxazole (Sulfamethoxazole)  Past History:  Past Medical History: Last updated: 03/25/2010 Angina phlebitis Bradycardia Exercise  Intolerance Diverticulosis Hemorrhoids Anemia Peptic Ulcer Disease Esophageal Stricture Prostate Cancer B12 Deficiency Borderline Diabetic Hernia HTN Arthritis  Family History: Reviewed history from 03/25/2010 and no changes required. Negative FH of Diabetes, Hypertension, or Coronary Artery Disease Family History of Colon Cancer: Aunt and Uncle  Social History: Reviewed history from 03/25/2010 and no changes required. 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  Alcohol Use - no Drug Use - no Illicit Drug Use - no  Review of Systems       denies weight change, headache, chest pain, sob, loc, diarrhea, urinary frequency, numbness, cramps, excessive diaphoresis, depression, hypoglycemia, and rhinorrhea.  he has chronic blurry vision, for which he ses opthal.  he has mild memory loss, easy bruising, and depression.   Physical Exam  General:  lean body habitus Head:  head: no deformity eyes: no periorbital swelling, no proptosis external nose and ears are normal mouth: no lesion seen Neck:  Supple without thyroid enlargement or tenderness.  Lungs:  Clear to auscultation bilaterally. Normal respiratory effort.  Heart:  Regular rate and rhythm without murmurs or gallops noted. Normal S1,S2.   Abdomen:  abdomen is soft, nontender.  no hepatosplenomegaly.   not distended.  no hernia  Msk:  muscle bulk and strength are grossly normal.  no obvious joint swelling.  gait is normal and steady  Pulses:  dorsalis pedis intact bilat.  no carotid bruit  Extremities:  no deformity.  no ulcer on the feet.  feet are of normal color and temp.  no edema  Neurologic:  cn 2-12 grossly intact.   readily moves all 4's.   sensation is intact to touch on the feet Skin:  normal texture and temp.  no rash.  not diaphoretic  Cervical Nodes:  No significant adenopathy.  Psych:  Alert and cooperative; normal mood  and affect; normal attention span and concentration.     Impression & Recommendations:  Problem # 1:  HYPERGLYCEMIA (ICD-790.29) he is evolving type 1 diabetes  Problem # 2:  gi sxs not related to #1  Problem # 3:  memory loss mild  Medications Added to Medication List This Visit: 1)  Nexium 40 Mg Cpdr (Esomeprazole magnesium) .Marland Kitchen.. 1 capsule by mouth once daily  Other Orders: New Patient Level IV ([address])  Patient Instructions: 1)  you are slowly developing type 1 (what used to be called "childhood-onset") diabetes.  unfortunately, there is nothing you can safely do to stop this.  you will need to take medication when this blood test reached about 7%.  only insulin will control this. 2)  you should have the "a1c" blood test about every 3 months or so.   3)  i am happy to see you back here as often as you and dr Tomasa Blase request.    Orders Added: 1)  New Patient Level IV [60454]

## 2010-04-15 ENCOUNTER — Encounter (AMBULATORY_SURGERY_CENTER): Payer: Medicare Other | Admitting: Internal Medicine

## 2010-04-15 ENCOUNTER — Encounter: Payer: Self-pay | Admitting: Internal Medicine

## 2010-04-15 DIAGNOSIS — R1319 Other dysphagia: Secondary | ICD-10-CM

## 2010-04-15 DIAGNOSIS — K449 Diaphragmatic hernia without obstruction or gangrene: Secondary | ICD-10-CM

## 2010-04-15 DIAGNOSIS — R12 Heartburn: Secondary | ICD-10-CM

## 2010-04-17 NOTE — Letter (Signed)
Summary: Foye Deer MD  Foye Deer MD   Imported By: Lester Glendora 04/08/2010 07:24:29  _____________________________________________________________________  External Attachment:    Type:   Image     Comment:   External Document

## 2010-04-22 NOTE — Miscellaneous (Signed)
Summary: pantoprazole rx  Clinical Lists Changes  Medications: Changed medication from NEXIUM 40 MG CPDR (ESOMEPRAZOLE MAGNESIUM) 1 capsule by mouth once daily to PANTOPRAZOLE SODIUM 40 MG TBEC (PANTOPRAZOLE SODIUM) 1 by mouth once daily 30 minutes before breakfast - Signed Rx of PANTOPRAZOLE SODIUM 40 MG TBEC (PANTOPRAZOLE SODIUM) 1 by mouth once daily 30 minutes before breakfast;  #30 x 11;  Signed;  Entered by: Iva Boop MD, Clementeen Graham;  Authorized by: Iva Boop MD, FACG;  Method used: Electronically to [facility]  [id number] [address] [place], [place]  [address], Ph: [phone] or [phone], Fax: [phone]    Prescriptions: PANTOPRAZOLE SODIUM 40 MG TBEC (PANTOPRAZOLE SODIUM) 1 by mouth once daily 30 minutes before breakfast  #30 x 11   Entered and Authorized by:   Iva Boop MD, [facility]   Signed by:   Iva Boop MD, [facility] on 04/15/2010   Method used:   [facility]ronically to        [facility]  [id number] (retail)       [address]       [place], [place]  [address]       Ph: [phone] or [phone]       Fax: [phone]   RxID:   [id number]

## 2010-04-22 NOTE — Procedures (Signed)
Summary: Upper Endoscopy  Patient: Alan [name] Note: All result statuses are Final unless otherwise noted.  Tests: (1) Upper Endoscopy (EGD)   EGD Upper Endoscopy       DONE     [facility]     [address]     [place], [place]  [address]          ENDOSCOPY PROCEDURE REPORT          PATIENT:  Alan [name], Alan [name]  MR#:  [id number]     BIRTHDATE:  [birth year], 78 yrs. old  GENDER:  male          ENDOSCOPIST:  Iva Boop, MD, [facility]     Referred by:          PROCEDURE DATE:  04/15/2010     PROCEDURE:  EGD, diagnostic 66063     ASA CLASS:  Class II     INDICATIONS:  dysphagia, heartburn          MEDICATIONS:   Fentanyl 25 mcg IV, Versed 4 mg IV     TOPICAL ANESTHETIC:  Exactacain Spray          DESCRIPTION OF PROCEDURE:   After the risks benefits and     alternatives of the procedure were thoroughly explained, informed     consent was obtained.  The [id number] endoscope was     introduced through the mouth and advanced to the second portion of     the duodenum, without limitations.  The instrument was slowly     withdrawn as the mucosa was fully examined.     <<PROCEDUREIMAGES>>          A hiatal hernia was found. Small 2-3 cm, sliding.  Otherwise the     examination was normal. There is some mild spasm seen in the     esophagus.    Retroflexed views revealed a hiatal hernia.    The     scope was then withdrawn from the patient and the procedure     completed.          COMPLICATIONS:  None          ENDOSCOPIC IMPRESSION:     1) Hiatal hernia     2) Otherwise normal examination     RECOMMENDATIONS:     Continue PPI. Try pantoprazole 40 mg daily. Prescription sent.     His dysphagia had resolved and no stricture so no dilation     today.          Iva Boop, MD, Clementeen Graham          CC:  Foye Deer, MD     The Patient          n.     Rosalie Doctor:   Iva Boop at 04/15/2010 03:40 PM          Carin Primrose, [id number]  Note: An  exclamation mark (!) indicates a result that was not dispersed into the flowsheet. Document Creation Date: 04/15/2010 3:40 PM _______________________________________________________________________  (1) Order result status: Final Collection or observation date-time: 04/15/2010 15:29 Requested date-time:  Receipt date-time:  Reported date-time:  Referring Physician:   Ordering Physician: Stan Head [phone]) Specimen Source:  Source: Launa Grill Order Number: [id number] Lab site:

## 2010-04-23 ENCOUNTER — Encounter: Payer: Self-pay | Admitting: *Deleted

## 2010-04-29 ENCOUNTER — Telehealth: Payer: Self-pay | Admitting: Internal Medicine

## 2010-04-29 NOTE — Telephone Encounter (Signed)
Let him know I am sorry that happened. He should go back to Zantac and Prilosec OTC if that seemed to work better, and stop pantoprazole. Do not want him feeling worse!

## 2010-04-29 NOTE — Telephone Encounter (Signed)
Sending again 

## 2010-04-29 NOTE — Telephone Encounter (Signed)
Spoke to pt's wife to inform her per Dr Leone Payor, pt may go back to the Zantac and Prilosec. Please call for problems.

## 2010-04-29 NOTE — Telephone Encounter (Signed)
Pt had Endo on 04/15/10 and he was instructed to begin Pantoprazole. EGD showed HH , otherwise normal exam.He reports he's much worse now- had a bad spell last pm. He has raised the HOB, diet doesn't matter and the pain or soreness begins in the center of his chest below the breastbone. Pt stated he thought he did better with Zantac bid and Prilosec qhs. Also, before chewing gum helped, but not anymore. Please advise.

## 2010-05-05 ENCOUNTER — Encounter: Payer: Self-pay | Admitting: Pulmonary Disease

## 2010-05-05 ENCOUNTER — Ambulatory Visit (INDEPENDENT_AMBULATORY_CARE_PROVIDER_SITE_OTHER): Payer: Medicare Other | Admitting: Pulmonary Disease

## 2010-05-05 DIAGNOSIS — G4733 Obstructive sleep apnea (adult) (pediatric): Secondary | ICD-10-CM

## 2010-05-05 DIAGNOSIS — G4700 Insomnia, unspecified: Secondary | ICD-10-CM

## 2010-05-05 DIAGNOSIS — F5104 Psychophysiologic insomnia: Secondary | ICD-10-CM | POA: Insufficient documentation

## 2010-05-05 MED ORDER — TRAZODONE HCL 50 MG PO TABS: 50.0000 mg | ORAL_TABLET | Freq: Every day | ORAL | Status: AC

## 2010-05-05 NOTE — Assessment & Plan Note (Signed)
The pt has a h/o mild to moderate osa that may or may not be contributing significantly to his overall sleep issue.  At this point, it would be very difficult to tolerate cpap trial with his current insomnia issue.  I would like to work on the insomnia first, then consider treatment of osa.

## 2010-05-05 NOTE — Assessment & Plan Note (Signed)
The pt has longstanding insomnia issues for greater than 37yrs that I suspect is due to anxiety and a "learned" behavior.  I have had a long discussion with him about behavioral therapy, and that longterm sleep meds are rarely the answer.  I would rather him be on something short term such as trazodone that does not build tolerance, while he is working on his behavioral therapies.  I have reviewed stimulus control therapy and good sleep hygiene with him.

## 2010-05-05 NOTE — Progress Notes (Signed)
Subjective:    Patient ID: Alan [name], male    DOB: 29-Sep-1931, 77 y.o.   MRN: [id number]  HPI The pt is a 77y/o male who comes in today for multiple sleep issues.  He did have a sleep study in [place] in 2011, with an AHI 19/hr, but has never been treated for this.  He notes sleeping issues for greater than 10 yrs, and primarily consists of sleep initiation and maintenance.  He feels anxiety, mind racing, and a "stuffy head" contribute to his issues.  He will fall asleep easily with xanax, but awakens during the middle of night multiple times, and cannot return to sleep without taking a "1/2 a xanax".  This may occur multiple times during night.  He typically stays in bed if he cannot fall asleep, tossing and turning.  He does not watch tv in bed or read in bed.  His bedpartner notes loud snoring and abnl breathing pattern during sleep.  He awakens at 9am unrested, but does not drink caffeine.  He can get sleepy during day if sits down, and family member states he dozes during the day if sits down for any time period.  He does not take a formal nap, but does take xanax as needed during day.  His epworth score is only 5 today, and he has not had any significant weight gain over the last 2 yrs.    Sleep Questionnaire: What time do you typically go to bed?( Between what hours) 11 pm- 1 am or later How long does it take you to fall asleep? 10 mins with help from xanax. How many times during the night do you wake up? 4 What time do you get out of bed to start your day? 0900 Do you drive or operate heavy machinery in your occupation? No How much has your weight changed (up or down) over the past two years? (In pounds) 10 lb (4.536 kg) Have you ever had a sleep study before? Yes If yes, location of study? [place] If yes, date of study? approx 2009 Do you currently use CPAP? No Do you wear oxygen at any time? No    Review of Systems  Constitutional: Positive for unexpected weight change. Negative for fever  and appetite change.  HENT: Positive for congestion and trouble swallowing. Negative for ear pain, sore throat, rhinorrhea, sneezing, dental problem and postnasal drip.   Eyes: Positive for redness.  Respiratory: Positive for shortness of breath. Negative for cough and wheezing.   Cardiovascular: Negative for chest pain, palpitations and leg swelling.  Gastrointestinal: Positive for abdominal pain. Negative for nausea and diarrhea.  Genitourinary: Negative for dysuria.  Musculoskeletal: Negative for joint swelling.  Skin: Negative for rash.  Neurological: Positive for syncope. Negative for headaches.  Hematological: Bruises/bleeds easily.  Psychiatric/Behavioral: Positive for dysphoric mood. The patient is nervous/anxious.        Objective:   Physical Exam Constitutional:  Well developed, no acute distress  HENT:  Nares patent without discharge, but narrowed  Oropharynx without exudate, palate and uvula are normal  Eyes:  Perrla, eomi, no scleral icterus  Neck:  No JVD, no TMG  Cardiovascular:  Normal rate, regular rhythm, no rubs or gallops.  2/6 sem        Intact distal pulses  Pulmonary :  Normal breath sounds, no stridor or respiratory distress   No rales, rhonchi, or wheezing  Abdominal:  Soft, nondistended, bowel sounds present.  No tenderness noted.   Musculoskeletal:  No lower  extremity edema noted.  Lymph Nodes:  No cervical lymphadenopathy noted  Skin:  No cyanosis noted  Neurologic:  Alert, appropriate, moves all 4 extremities without obvious deficit.         Assessment & Plan:

## 2010-05-05 NOTE — Patient Instructions (Signed)
Stop xanax at bedtime and during the night.  Can take trazodone 50mg  one before bedtime each night instead If you cannot initiate sleep within , get out of bed and go to family room to watch tv or read.  Can return when you get sleepy.  Do this as many times as needed until you fall asleep.   Get out of bed every morning by 7am. No caffeine after 10am No dozing or naps during waking hours, ever.   followup with me in 3 weeks.

## 2010-05-14 ENCOUNTER — Encounter: Payer: Self-pay | Admitting: Pulmonary Disease

## 2010-05-19 ENCOUNTER — Ambulatory Visit: Payer: Medicare Other | Admitting: Pulmonary Disease

## 2010-05-26 ENCOUNTER — Encounter: Payer: Self-pay | Admitting: Pulmonary Disease

## 2010-06-09 ENCOUNTER — Ambulatory Visit: Payer: Medicare Other | Admitting: Pulmonary Disease

## 2010-06-20 ENCOUNTER — Encounter: Payer: Self-pay | Admitting: Pulmonary Disease

## 2010-06-20 ENCOUNTER — Ambulatory Visit (INDEPENDENT_AMBULATORY_CARE_PROVIDER_SITE_OTHER): Payer: Medicare Other | Admitting: Pulmonary Disease

## 2010-06-20 DIAGNOSIS — G4733 Obstructive sleep apnea (adult) (pediatric): Secondary | ICD-10-CM

## 2010-06-20 DIAGNOSIS — G4700 Insomnia, unspecified: Secondary | ICD-10-CM

## 2010-06-20 DIAGNOSIS — F5104 Psychophysiologic insomnia: Secondary | ICD-10-CM

## 2010-06-20 NOTE — Patient Instructions (Signed)
Please review the info I have given you on dental appliance.  Let me know if I can refer you to a dentist here, or if you want to consider cpap for treatment Continue to try and work on the behavioral techniques I have reviewed with you.  I suspect you would benefit most from a referral to a behavioral specialist to work on anxiety and CBT for your insomnia.  Can refer you to Dr. Jennelle Human here, or can recommend to your primary md to refer closer to [place].  Let me know after considering all of this.

## 2010-06-20 NOTE — Progress Notes (Signed)
  Subjective:    Patient ID: Alan [name], male    DOB: [birth year], 77 y.o.   MRN: [id number]  HPI The pt comes in today for f/u of his known insomnia and osa.  It was decided to work on his insomnia first before tackling his osa, and was given a short trial of trazodone and asked to work on stimulus control therapy/sleep hygiene.  The pt comes in today where he really did not try the behavioral therapies I recommended, and did not take the trazodone for the reasons listed in chief complaint (worried about side effects).    Review of Systems  Constitutional: Negative for fever and unexpected weight change.  HENT: Positive for congestion, trouble swallowing and sinus pressure. Negative for ear pain, nosebleeds, sore throat, rhinorrhea, sneezing, dental problem and postnasal drip.   Eyes: Positive for redness and itching.  Respiratory: Negative for cough, chest tightness, shortness of breath and wheezing.   Cardiovascular: Negative for palpitations and leg swelling.  Gastrointestinal: Negative for nausea and vomiting.  Genitourinary: Positive for dysuria.  Musculoskeletal: Negative for joint swelling.  Skin: Negative for rash.  Neurological: Negative for headaches.  Hematological: Bruises/bleeds easily.  Psychiatric/Behavioral: Positive for dysphoric mood. The patient is nervous/anxious.        Objective:   Physical Exam Wd male in nad No nasal discharge or purulence Chest clear Cor with rrr LE without edema, no cyanosis Mildly sleepy, but appropriate, moves all 4        Assessment & Plan:

## 2010-06-24 NOTE — Assessment & Plan Note (Signed)
[facility]                         ELECTROPHYSIOLOGY OFFICE NOTE   KEILON, RESSEL                         MRN:          [id number]  DATE:10/12/2007                            DOB:          03-14-1931    Mr. Birkel comes in for followup for chronotropic incompetence.  It was  noted on treadmill testing, he got a maximum rate about 70.  However, he  says that when he went home, he was driving stakes with an axe.  He  ended up having heart rate of 133.  These were discordant, and I am not  quite sure how to reconcile them.   His energy levels are little bit better than it has been, but not nearly  what it was a couple of months ago.   His medications include aspirin, Cymbalta, Prilosec.   On examination, his blood pressure was 116/66 with a pulse of 53.  His  lungs were clear.  Heart sounds were regular with an S4.  Abdomen soft.  Extremities had no edema.   Electrocardiogram dated today demonstrated sinus rhythm of 53 with  interval 0.13/0.09/0.41, the axis was 17 degrees.   IMPRESSION:  1. Chronotropic incompetence by treadmill testing.  2. Fatigue.  3. Heart rate of 133 while doing upper extremity work with a Lexicographer      and an axe at the backyard.   I am not quite sure understand the discordance between the heart rates  generated by treadmill testing versus upper extremity exertion.  I will  need to look up some Physiology on this.  The other possibility for  arrhythmia point of view is that the one was associated with an AA, an  abnormal rhythm as opposed to sinus rhythm.   To clarify that possibility, we will undertake at 30-day of event  recorder.  I will review with them in about 3 weeks to 4 weeks time.     Duke Salvia, MD, [facility]  Electronically Signed    SCK/MedQ  DD: 10/12/2007  DT: 10/13/2007  Job #: [id number]   cc:   Lise Auer

## 2010-06-24 NOTE — Letter (Signed)
November 24, 2007    Lise Auer, MD  [address]  [place], [place] [address]   RE:  [name], Alan  MRN:  [id number]  /  DOB:  1931/03/25   Dear Fara Olden,   Alan [name] comes in following his 20-day event recorder.  It was very  interesting, Mick, is that he had an average rate of most days in the  70s, peak rates in the 100-125 range, all consistent with normal  chronotropic function.   It is still unclear to me then why it is that he is so variably limited  in his exercise tolerance.  He describes being able to take the log more  up the hill 10 times and then other day he will not be able to do  anything.  I do not know that this is going to turn out to be a primary  heart issue.  As you recall, he had a negative Myoview with normal left  ventricular function.   The last thing I thought it might give [place] some clarity as to which way  to pursue would be to see cardiopulmonary stress test.  So, I have told  him that if this is unrevealing, I am going to have to confess that I do  not know how better to be able to help him.  At last, I will have him  come back to see you about that.   Thank you very much for allowing [place] to participate in his care.    Sincerely,      Duke Salvia, MD, [facility]  Electronically Signed   SCK/MedQ  DD: 11/24/2007  DT: 11/25/2007  Job #: [id number]

## 2010-06-24 NOTE — Letter (Signed)
September 09, 2007    Asencion Gowda, MD  [address]  [place], [place] [address]   RE:  ESTEBAN, KOBASHIGAWA  MRN:  [id number]  /  DOB:  [birth year]   Dear Fara Olden,   Mr. Preis came in today following his treadmill test.  As you had  surmised from his PT report, chronotropic incompetence is a clear issue  here.  We submitted him for a treadmill test at the time of his Myoview  and he accomplished a  maximum rate of 72.  He thinks that when he is  out in the backyard with his mattock he can do better.  In any case, we  talked about the potential benefits of pacing and he is agreeable to  proceeding with this, although he is currently undergoing evaluation  with Dr. Adella Hare for burning and tingling in his feet, which is  accompanied by some edema.   I look forward to hearing from Dr. Adella Hare as to what his diagnosis is  and potentially unifying diagnosis can be made.   At that point we will proceed with consideration of pacemaker  implantation.   It should be noted that his medications today include aspirin and Xanax.   PHYSICAL EXAMINATION:  VITAL SIGNS:  His blood pressure was 119/70 with  a pulse of 76.  LUNGS:  Clear.  HEART:  Heart sounds were regular.  ABDOMEN:  Soft.  EXTREMITIES:  Without edema.   Thank you very much.  We look forward to hearing from Mr. Ruhlman about  proceeding with pacemaker implantation.    Sincerely,      Duke Salvia, MD, [facility]  Electronically Signed    SCK/MedQ  DD: 09/09/2007  DT: 09/10/2007  Job #: [id number]

## 2010-06-24 NOTE — Letter (Signed)
December 28, 2007    Lise Auer, M.D.  [address]  [place], [place] [address]   RE:  ZADKIEL, DRAGAN  MRN:  [id number]  /  DOB:  [birth year]   Dear Fara Olden:   I continue to try to understand Mr. Jandreau and his variable exercise  tolerance.  As you recall, he has had variable heart rates identified by  various means of testing.  On a recent CPX what we noted was that he  went through stage I and II with heart rate in the low 60s; when he went  to stage III, his heart rate went to 90s and then his heart rate went to  120 which is 82% as predicted maximal heart rate as he entered stage IV.   What we had seen on outpatient event recording was heart rates in the  100-125 range and consistent with normal chronotropic function, but that  when we had put him on the treadmill, we had seen heart rates only into  the 70s.  With concerns about chronotropic competence and the variable  response to exercise has raised the question for me as to whether this  may explain in part his variable exercise performance.   There has also been some episodes of presyncope associated with  prolonged standing.  He has not had any of these recently.  As you  recall, orthostatic testing done in May a couple of years ago failed to  show anything at that time.   His overall left ventricular function is normal.  His Myoview perfusion  studies are normal.  I have suggested to them that seeing one of the  very thoughtful other electrophysiologist in the region might be of  benefit and I have given them a couple of names and we look forward to  hearing from them as whether they would like to do that.   In the in-between, we will be glad to see him again as needed.  Should  note that today his blood pressure was 108/59, his pulse was 55.  His  neck veins were flat.  His lungs were clear.  Heart sounds were regular  with no  S4 and 2/6 murmur.  The extremities had no edema.   His CPX in addition to the heart rate response  as noted with an 82%  predicted maximal heart rate had a PVO2 of 27.2.   If I can be of any further I can do, please do not hesitate to contact  me.    Sincerely,      Duke Salvia, MD, [facility]  Electronically Signed    SCK/MedQ  DD: 12/28/2007  DT: 12/29/2007  Job #: [id number]

## 2010-06-24 NOTE — Letter (Signed)
August 17, 2007    Lise Auer  [address]  [place], [place] [address]   RE:  CORWYN, VORA  MRN:  [id number]  /  DOB:  1931/12/19   Dear Fara Olden,   It has been some time since we have spoken.  Carin Primrose came in today at  your request, and I appreciate the opportunity to see him.   Your observation was that during exercise at physical therapy his peak  heart rate was 60.  Contemporaneous with this has been progressive  dyspnea on exertion over the last year and particularly over the last 2  months.  There has also been accompanying peripheral edema and fatigue.  There has been no associated chest pain or shortness of breath.  There  has also been some presyncope without syncope.  The presyncope, however,  is mostly provoked by standing and this is accompanied by tingling in  his hips with standing which improves with sitting.   In the past, his cardiac evaluation has apparently been negative.  He  has a history of orthostatic intolerance in the past.   His cardiac risk factors are notable for family history but are  otherwise negative for cigarettes, hypertension, or dyslipidemia.   His past medical history is as above and is notable for GE reflux  disease, prostate cancer status post prostatectomy (only surgery),  anxiety disorder, and mild arthritis.   Related to the latter, he started taking BuSpar a couple of months ago.  After the aforementioned symptoms developed, he stopped thinking that  maybe it was the culprit; the symptoms did not change.   SOCIAL HISTORY:  He is married, and he re-married.  He has children from  his first marriage.  He does not use cigarettes or recreational drugs.  He does use alcohol occasionally.   His medications at present include Xanax 0.25 at bedtime.   PHYSICAL EXAMINATION:  VITAL SIGNS:  His blood pressure was 132/64.  His  pulse was 53.  HEENT:  Demonstrated no icterus or xanthoma.  NECK:  His neck veins were flat.  The carotids were brisk  and full  bilaterally without bruits.  BACK:  Without kyphosis or scoliosis.  LUNGS:  Clear.  HEART:  Heart sounds were regular without murmurs or gallops.  ABDOMEN:  Soft with active bowel sounds without midline pulsation or  hepatomegaly.  EXTREMITIES:  Femoral pulses were 2+.  Distal pulses were intact.  There  was no clubbing, cyanosis, or edema.  NEUROLOGIC:  Grossly normal.  SKIN:  Warm and dry.   Electrocardiogram dated today demonstrated sinus rhythm at 54.   The intervals were normal, but I have misplaced it.  The  electrocardiogram from your office demonstrated sinus rhythm, also about  54, with intervals of 0.14/0.08/0.44.   IMPRESSION:  1. Bradycardia with question chronotropic incompetence.  2. Exercise intolerance and peripheral edema concurrent with the      above.  3. New symptoms of distal lower extremity tingling and numbness,      question peripheral neuropathy.  4. Buttock pain associated with standing.   Mick, from the cardiac point of view I think the thing to do is to  assess chronotropic competence, look at left ventricular function.  To  do this, we would have to take treadmill testing to see if he can  generate appropriate heart rate.  In the event that he cannot, pacing  would clearly be indicated.   The next question then would be is there a mechanism  that is potentially  contributing to the above or is there a concomitant cardiac disease  which is responsible for the new-onset peripheral edema and exercise  intolerance.  Left ventricular function and myocardial perfusion  studies, I think, are appropriate in this older gentleman to assess  that.   I have gone ahead and scheduled this, and I will forward the results of  these to you as soon as we have them.   Thanks very much for allowing [place] to see him.    Sincerely,      Duke Salvia, MD, [facility]  Electronically Signed    SCK/MedQ  DD: 08/17/2007  DT: 08/18/2007  Job #: [id number]

## 2010-06-25 ENCOUNTER — Encounter: Payer: Self-pay | Admitting: Pulmonary Disease

## 2010-06-25 NOTE — Assessment & Plan Note (Signed)
It remains unclear how much this is contributing to his overall sleep issue.  Given that he is not going to work on the suggestions I have recommended, will go ahead and try to treat his sleep apnea.  I have discussed with him both cpap and dental appliance.  I have given him literature on both to consider.  He is to get back with me after deciding.

## 2010-06-25 NOTE — Assessment & Plan Note (Signed)
The pt has not tried the sleep hygiene/behavioral therapy I have recommended, nor is he willing to take trazodone short term.  I really think he would be best served by seeing a behavioral therapist to work on CBT (cognitive behavioral therapy).  He is to let me know if he wishes to be referred to a psychiatrist here in [place] vs closer to his home.

## 2010-06-27 NOTE — H&P (Signed)
NAME:  Alan [name], Alan [name]                            ACCOUNT NO.:  [id number]   MEDICAL RECORD NO.:  [id number]                   PATIENT TYPE:  INP   LOCATION:  0362                                 FACILITY:  [facility]   PHYSICIAN:  Lucrezia Starch. Ovidio Hanger, M.D.           DATE OF BIRTH:  1931-04-06   DATE OF ADMISSION:  02/27/2003  DATE OF DISCHARGE:  03/03/2003                                HISTORY & PHYSICAL   DIAGNOSIS:  Adenocarcinoma of the prostate.   OPERATIVE PROCEDURE:  Radical retropubic prostatectomy on February 27, 2003.   CHIEF COMPLAINT:  I have prostate cancer.   HISTORY OF PRESENT ILLNESS:  Alan [name] is a pretty nice 77 year old white  male found to have an elevated PSA, subsequently underwent ultrasound biopsy  of the prostate which revealed a Gleason VI adenocarcinoma, with 3+3  adenocarcinoma of the prostate.  After understanding risks, benefits and  alternatives, he elected to proceed with radical retropubic prostatectomy.   PAST MEDICAL HISTORY:  1. He has a history of phlebitis.  2. Angina, although not recently.  3. He has had no surgeries.   He has allergies to:  SULFA.   MEDICATIONS:  Xanax, Uroxatral, various vitamins.   SOCIAL HISTORY:  He is a negative drinker.  Negative smoker.   FAMILY HISTORY:  Not significant.   REVIEW OF SYSTEMS:  He has no shortness of breath, dyspnea on exertion,  recent chest pain, or GI complaints.   PHYSICAL EXAMINATION:  VITAL SIGNS:  Blood pressure is 108/70, pulse 55,  respirations 18, temperature 97.9 degrees Fahrenheit.  GENERAL:  He is well nourished, well developed, well groomed.  HEENT:  Normal.  NECK:  Without mass or thyromegaly.  CHEST:  Normal diaphragmatic motion.  ABDOMEN:  Soft, nontender.  No mass, organomegaly or hernias.  EXTREMITIES:  Normal.  NEURO:  Intact.  SKIN:  Normal.  GENITOURINARY:  Penis, meatus, scrotum, testicles, anus, and perineum are  normal.  Rectal vault is empty.  Prostate is 30 grams,   really benign to  palpation.   IMPRESSION:  Stage T1C adenocarcinoma of the prostate.   PLAN:  Radical retropubic prostatectomy.                                               Ronald L. Ovidio Hanger, M.D.    RLD/MEDQ  D:  03/24/2003  T:  03/24/2003  Job:  [id number]

## 2010-06-27 NOTE — Discharge Summary (Signed)
NAME:  Alan [name]                            ACCOUNT NO.:  [id number]   MEDICAL RECORD NO.:  [id number]                   PATIENT TYPE:  INP   LOCATION:  0362                                 FACILITY:  [facility]   PHYSICIAN:  Lucrezia Starch. Ovidio Hanger, M.D.           DATE OF BIRTH:  02-08-1932   DATE OF ADMISSION:  02/27/2003  DATE OF DISCHARGE:  03/03/2003                                 DISCHARGE SUMMARY   DIAGNOSIS:  Adenocarcinoma of the prostate.   OPERATIVE PROCEDURE:  Radical retropubic prostatectomy on February 27, 2003.   HISTORY AND PHYSICAL EXAMINATION:  Alan [name] is a very nice 77 year old  white male who presented with elevated PSA and subsequently underwent  transurethral ultrasound and biopsy of the prostate revealing a Gleason  score 6 which is 3 + 3 adenocarcinoma.  After understanding risks, benefits,  and alternatives, he elected to proceed with radical retropubic  prostatectomy.   For Past Medical History, Social History, Family History, and Review of  Systems please see History and Physical for details.   PHYSICAL EXAMINATION:  VITAL SIGNS:  He is afebrile with vital signs stable.  GENERAL:  Well developed, well nourished, and well groomed.  HEENT:  Head ear, nose, and throat normal.  NECK:  Without masses or thyromegaly.  CHEST:  Normal diaphragmatic motion.  ABDOMEN:  Soft, nontender without masses, organomegaly, or hernias.  EXTREMITIES:  Normal.  NEUROLOGICAL:  Intact.  SKIN:  Normal.  GENITOURINARY:  Penis, meatus, scrotum, testicles, adnexa, anus, perineum  are normal.  RECTAL:  The rectal vault is empty.  Prostate is really 30 grams and smooth,  symmetrical and really benign to palpation.   HOSPITAL COURSE:  The patient was admitted.  After undergoing proper  preoperative evaluation, he was subsequently taken to surgery on February 27, 2003 and underwent radical retropubic prostatectomy uneventfully.  Immediately postoperatively, he was doing well, the  pain was well  controlled, vital signs were stable, there was minimal [place] outpatient.  Dressing was dry and he was comfortable.  By postop day #1, which was  February 28, 2003, he was again afebrile and comfortable, [place] outpatient  was low, wound was clean.  Hemoglobin was 8.9, hematocrit 26.3, white blood  cell count was 6.2.  BMET was essentially normal.  Sodium was slightly low  at 132.  By postop day #2, March 01, 2003, hemoglobin was 8.7 and although  he had positive flatus and was beginning to eat and [facility] outpatient was  very low, it was felt that he probably should be transfused with blood.  He  started with a low hemoglobin and had very little blood loss but most of it  was dilutional we felt.  He was transfused a unit of packed red blood cells.  By March 01, 2002, again he was afebrile, had positive flatus, no bowel  movement yet, I&O was stable.  His path had negative  margins.  It was a  stage TIIc Gleason score 3 + 4 = 7.  His path was discussed.  He continued  to progressively improve and by March 03, 2003 he had some anxiety due to  claustrophobia but he continued to progressively improve.  He was  discharged.   DISCHARGE MEDICATIONS:  Included Vicodin and Cipro.   DISCHARGE CONDITION:  Was improved as noted.  Wound was clean and dry.  The  [place] drain had been removed.  He was ambulatory, tolerating food.  He was  to return in a week for staple removal and to call if he had problems in the  interim.  Instructions were given and seemed to be understood.                                               Ronald L. Ovidio Hanger, M.D.    RLD/MEDQ  D:  03/24/2003  T:  03/24/2003  Job:  [id number]

## 2010-06-27 NOTE — Op Note (Signed)
NAME:  Alan [name], Alan [name]                            ACCOUNT NO.:  [id number]   MEDICAL RECORD NO.:  [id number]                   PATIENT TYPE:  INP   LOCATION:  0362                                 FACILITY:  [facility]   PHYSICIAN:  Lucrezia Starch. Earlene Plater, M.D.               DATE OF BIRTH:  06/27/1931   DATE OF PROCEDURE:  02/28/2003  DATE OF DISCHARGE:                                 OPERATIVE REPORT   PREOPERATIVE DIAGNOSIS:  Adenocarcinoma of the prostate.   POSTOPERATIVE DIAGNOSIS:  Adenocarcinoma of the prostate.   PROCEDURE:  Radical retropubic prostatectomy.   SURGEON:  Lucrezia Starch. Earlene Plater, M.D.   ASSISTANT:  1. Veverly Fells. Vernie Ammons, M.D.  2. Susanne Borders, MD   ANESTHESIA:  General endotracheal.   DRAINS:  A 20 French Foley catheter to straight drain, #10 Blake drain bulb  suction.   ESTIMATED BLOOD LOSS:  300 mL   SPECIMENS:  Prostate for pathologic analysis.   COMPLICATIONS:  None.   DISPOSITION:  To post anesthesia care unit in stable condition.   INDICATIONS FOR PROCEDURE:  Mr. Groesbeck is a 77 year old white male who was  found to have an elevated PSA.  He was subsequently sent to Dr. Earlene Plater.  He  underwent a transrectal ultrasound guided prostate biopsy which demonstrated  Gleason 3+3=6 adenocarcinoma.  The patient's various treatment options were  discussed with him in detail. The patient decided that he would like to  undergo radical retropubic prostatectomy after understanding the risks,  benefits, and alternatives.  He gave his informed consent for the procedure.   DESCRIPTION OF PROCEDURE:  The patient was brought to the operating room,  correctly identified by his identification bracelet.  He was given  preoperative antibiotics and general endotracheal anesthesia.  He was placed  in the supine position with the table slightly flexed in reverse  Trendelenburg.  A bump was placed under his pubis.  An approximately 8 cm  midline infraumbilical incision was made with the scalpel.   Bovie  electrocautery was used to incise the Camper and Scarpa's fascia down to the  __________ fibers of the rectus sheath.  The rectus sheath was incised with  the Bovie electrocautery exposing the two bellies of the rectus abdominis  musculature. The transversalis fascia was incised in the midline using Bovie  electrocautery.  The space of Retzius was then clearly defined using blunt  dissection with the surgeon's hand.  The fatty tissue overlying the  endopelvic fascia was swept posteromedially bilaterally.  The endopelvic  fascia was incised on both sides by making a small incision with Metzenbaum  scissors and using Bovie electrocautery to carry the incision cranially.  The puboprostatic ligaments were also identified by sweeping the areolar  tissue away from them.  These ligaments were incised bilaterally with  Metzenbaum scissors to drop the apex of the prostate away from the pubis.  The Bookwalter retractor was placed  for adequate exposure.  Using surgeon's  fore finger and thumb, a groove was palpated between the posterior dorsal  venous complex and the anterior wall of the urethra.  A McDougall right  angle clamp was then passed through this groove and a #1 Vicryl tie was  placed around the dorsal venous complex and tied down.  The McDougall right  angle was again used to place a second suture ligature across the dorsal  venous complex.  The Stamey retractor was used to retract the prostate  superiorly and a back bleeding stitch using 2-0 Vicryl was placed at the  bladder neck.  The McDougall right angle was placed posterior to the dorsal  venous complex and dorsal venous complex was incised with a Bovie  electrocautery.  There is very minimal bleeding after incision in the dorsal  venous complex and no further over sewing of this was necessary.  The  urethra was then identified with the neurovascular bundle lateral to it on  both sides.  Neurovascular structures were carefully  dissected away from the  lateral wall of the urethra on both sides.  A right angle could then be  easily passed beneath the urethra and a piece of umbilical tape was placed  posterior to the urethra. The anterior two-thirds of the urethral wall was  then incised with a 15 blade long handle scalpel.  The __________ and Foley  catheter were then sized and well lubricated and pulled into the pelvis.  The posterior urethra was then incised with Metzenbaum scissors.  There was  noted to be significant rectourethralis attachment to the posterior aspect  of the prostate and these were very carefully dissected freely using right  angle dissection and Metzenbaum scissors.  Finally a very nice plane was  established between Denonvillier's fascia and the fatty tissue of the  rectum.  This plane was carried superiorly using blunt dissection with the  Kidner and sharp dissection with Metzenbaum scissors when needed.  Once the  lateral pedicles of the prostate gland were well exposed and the anterior  wall of the rectum was pushed out of the way, the lateral pedicles were  systematically taken down with right angle dissection and Hem-o-Lok  clips  with an incision using Metzenbaum scissors.  It should be noted that prior  to the dissection of the apex of the prostate, the lateral prostatic fascia  was taken down using right angle dissection and Bovie electrocautery.  This  was done anterior to the prostate to allow the posterior running  neurovascular bundles to drop out of the plane of dissection.  As the  lateral pedicles were subsequently taken down.  There was no risk of injury  to these structures as they were well posterior to our plane of dissection.  After the lateral pedicles were taken down adequately on both sides, the  seminal vesicles could be palpated beneath Denonvillier's fascia.  This  fascia was incised in a transverse fashion with the Bovie electrocautery. Metzenbaum scissors were then  used to further define this space and  Denonvillier's fascia was incised further laterally, the right angle  dissection of Bovie electrocautery. The ampulla and the vas were then  dissected freely and clipped with Hem-o-Lok  clips and incised. The seminal  vesicles were then dissected with right angle dissection and were clipped  posteriorly with Hem-o-Lok  clips and incised with Metzenbaum scissors.  Attention was then turned to the bladder neck and the Bovie electrocautery  was used to create the  plane between the base of the prostate and the  bladder neck.  A tonsil was used to carefully define the stump of the  urethra just proximal to the prostate.  This is incised with a Bovie  electrocautery revealing the bladder mucosa.  This plane was further carried  posteriorly to completely free the base of the prostate from the bladder  neck.  The bladder neck opening was quite small and the ureteral orifices  could be identified within the bladder and they were without injury.  At  this point, there were some remaining lateral attachments to the prostate  bilaterally which were taken down with Metzenbaum scissors and Hem-o-Lok  clips.  Having freed the prostate entirely, the specimen was passed off the  table. There was some very minor bleeding near the bladder neck which was  controlled with the Bovie electrocautery.  The bladder mucosa was then  everted with 4-0 chromic sutures. A 3-0 chromic was placed at 6 o'clock in  the bladder neck to close this defect slightly with a running suture. The  bladder neck was thus everted and was approximately nickel size.  The  anastomotic sutures were then placed in the urethral stump at 2, 5, 7, 10  and 12 o'clock.  These sutures were 2-0 Vicryl.  The bladder was released  from its superior retraction and the anastomotic sutures were placed at  their corresponding positions at the bladder neck.  The bladder was then  allowed to drop down into the pelvis  and then anastomotic sutures were  cinched and tied down carefully.  The Foley catheter was then irrigated with  normal saline and there was no obvious leakage at the anastomosis.  The  pelvis was copiously irrigated with more antibiotic solution.  A #10 flat  Blake drain was placed in the left lower quadrant by making a small stab  incision through the skin and subcutaneous tissue with a scalpel.  A tonsil  was then passed through the anterior abdominal wall, grasping the drain and  pulling it through the abdominal wall.  The drain was sewn in place with a 3-  0 silk.  Prior to placing the 12 o'clock anastomotic suture, the Foley  catheter was carefully placed through the urethra and into the bladder.  A 2-  0 Prolene was sewn to the opening in the Foley catheter and was passed  through the anterior bladder wall.  The Prolene was later passed through the  right side of the abdominal wall and was tied to a button.  The Foley catheter was filled with 15 mL of sterile water.  The rectus abdominis  musculature was then reapproximated with three interrupted 2-0 chromic  sutures. The rectus fascia was closed with a #1 PDS in a running fashion.  The subcutaneous tissue was copiously irrigated and was closed with a  stapling device. The Foley catheter was placed on light traction with a  safety pin and rubber band.  The patient was then allowed to awaken from his  anesthesia and did so without complications.  He was taken to the post  anesthesia care unit in stable condition having tolerated the procedure  extremely well.  It is noted that Dr. Earlene Plater was present and participated in  all aspects of this case since he was the primary surgeon.     Susanne Borders, MD  Ronald L. Earlene Plater, M.D.    DR/MEDQ  D:  02/28/2003  T:  03/01/2003  Job:  [id number]

## 2010-09-16 ENCOUNTER — Encounter: Payer: Self-pay | Admitting: Internal Medicine

## 2010-10-14 ENCOUNTER — Encounter: Payer: Self-pay | Admitting: Internal Medicine

## 2010-10-14 ENCOUNTER — Ambulatory Visit (INDEPENDENT_AMBULATORY_CARE_PROVIDER_SITE_OTHER): Payer: Medicare Other | Admitting: Internal Medicine

## 2010-10-14 DIAGNOSIS — I951 Orthostatic hypotension: Secondary | ICD-10-CM

## 2010-10-14 DIAGNOSIS — R42 Dizziness and giddiness: Secondary | ICD-10-CM

## 2010-10-14 DIAGNOSIS — I6529 Occlusion and stenosis of unspecified carotid artery: Secondary | ICD-10-CM

## 2010-10-14 DIAGNOSIS — I495 Sick sinus syndrome: Secondary | ICD-10-CM

## 2010-10-14 HISTORY — DX: Dizziness and giddiness: R42

## 2010-10-14 NOTE — Assessment & Plan Note (Signed)
We discussed the physiology of orthostasis. It occurs in the context of hypertensive vascular disease. I do not think related to his bradycardia.

## 2010-10-14 NOTE — Patient Instructions (Signed)
Your physician wants you to follow-up in: 1 year with Dr. Logan Bores will receive a reminder letter in the mail two months in advance. If you don't receive a letter, please call our office to schedule the follow-up appointment.  Your physician has requested that you have a carotid duplex in 1 year. This test is an ultrasound of the carotid arteries in your neck. It looks at blood flow through these arteries that supply the brain with blood. Allow one hour for this exam. There are no restrictions or special instructions.  Your physician recommends that you continue on your current medications as directed. Please refer to the Current Medication list given to you today.

## 2010-10-14 NOTE — Assessment & Plan Note (Signed)
The patient's bradycardia is relatively stable. I've asked him to decide whether he would like to follow up with also with Dr. Sharol Harness. At this point he is going to favor coming here.

## 2010-10-14 NOTE — Assessment & Plan Note (Signed)
He had moderate stenosis earlier this year. We'll recheck it again when he comes back in one year's time

## 2010-10-14 NOTE — Progress Notes (Signed)
  HPI  Alan [name] is a 77 y.o. male  Seen in followup for bradycardia and chronotropic incompetence giving rise to some degree of exercise intolerance. He has been seen both here and at [facility]. Most recently he was seen again there were a heart rhythm monitor failed to detect any significant bradycardia. The plan has been to follow him forward waiting for symptoms.  He does have some dizziness. This is primarily associated with standing.  He also notes that he had carotid Dopplers done in [place] where he had mild instruction. He was like to have that followed here going forward  Past Medical History  Diagnosis Date  . Hypertension   . Angina at rest   . Phlebitis   . Bradycardia   . Exercise intolerance   . Diverticulosis   . Hemorrhoids   . Anemia   . Peptic ulcer disease   . Esophageal stricture   . Prostate cancer   . Vitamin B 12 deficiency   . Diabetes mellitus     Borderline  . Hernia   . Arthritis     Past Surgical History  Procedure Date  . Retropubic prostatectomy 2005    Radical  . Skin surgery     basal cell carcinoma    Current Outpatient Prescriptions  Medication Sig Dispense Refill  . ALPRAZolam (XANAX) 0.25 MG tablet Take one tablet by mouth three times daily        . aspirin 81 MG tablet Take 81 mg by mouth daily.        . Coenzyme Q10 (CO Q 10) 100 MG CAPS Take one by mouth twice daily       . cyanocobalamin (,VITAMIN B-12,) 1000 MCG/ML injection Take one injection once a month       . Folic Acid-Vit B6-Vit B12 (B COMPLEX-FOLIC ACID) 500-5-200 MCG-MG-MCG TABS Take 1 tablet by mouth daily.        . Omega-3 Fatty Acids (FISH OIL) 1000 MG CAPS Take one capsule two times a day       . Xylometazoline HCl (OTRIVIN NA) Nasal spray as needed twice daily         Allergies  Allergen Reactions  . Sulfamethoxazole     REACTION: throat swelled    Review of Systems negative except from HPI and PMH  Physical Exam Well developed and well  nourished in no acute distress HENT normal E scleral and icterus clear Neck Supple JVP flat; carotids brisk and full Clear to ausculation Regular rate and rhythm, no murmurs gallops or rub Soft with active bowel sounds No clubbing cyanosis and edema Alert and oriented, grossly normal motor and sensory function Skin Warm and Dry  ECGsinus rhythm at 52 Intervals 0.13/0.08/0.40 Axis is 17 Assessment and  Plan

## 2011-03-16 ENCOUNTER — Other Ambulatory Visit: Payer: Self-pay | Admitting: Urology

## 2011-03-16 DIAGNOSIS — N393 Stress incontinence (female) (male): Secondary | ICD-10-CM

## 2011-03-17 ENCOUNTER — Other Ambulatory Visit: Payer: Medicare Other

## 2011-03-17 ENCOUNTER — Ambulatory Visit
Admission: RE | Admit: 2011-03-17 | Discharge: 2011-03-17 | Disposition: A | Discharge status: Home / Self Care | Payer: Medicare Other | Source: Ambulatory Visit | Attending: Urology | Admitting: Urology

## 2011-03-17 DIAGNOSIS — N393 Stress incontinence (female) (male): Secondary | ICD-10-CM

## 2011-05-13 ENCOUNTER — Telehealth: Payer: Self-pay | Admitting: Internal Medicine

## 2011-05-13 NOTE — Telephone Encounter (Signed)
I reviewed the patient's symptoms with Dr. Daleen Squibb. I also noticed after getting off the phone with the patient that he has seen Dr. Shelle Iron for some sleep apnea. Per Dr. Daleen Squibb, the patient should get an appointment today with Dr. Tomasa Blase to have his BP addressed. I have relayed this to the patient's wife and she is agreeable. I also inquired if he was being treated for his sleep apnea currently and she reported that he is not. I explained this may have some bearing on his elevated BP and he should address this with Dr. Tomasa Blase. The patient's wife verbalizes understanding.

## 2011-05-13 NOTE — Telephone Encounter (Signed)
I spoke with the patient's wife. She states that the patient's BP has been creeping up gradually. He typically runs about 130-140 systolic. On 4/1 he was 157/77 in the morning and systolic was 134 that night. He was 168/80 this morning. He always has complaints of dizziness and sinus headaches and reports no change in these symptoms. However, he does report flushing in his face today. He is not taking any BP meds. I explained I will review with the DOD and call them back. They are agreeable.

## 2011-05-13 NOTE — Telephone Encounter (Signed)
New Msg: Pt wife calling wanting to speak with nurse/MD regarding pt BP shoot up to 168/80. Pt BP just shot up today. Pt, pt wife stated pt BP has been going up some but this is the highest it has been. Pt wife/pt wanted to know if pt can be seen today. Please return pt call to discuss further.

## 2011-05-26 ENCOUNTER — Ambulatory Visit (INDEPENDENT_AMBULATORY_CARE_PROVIDER_SITE_OTHER): Payer: Medicare Other | Admitting: Internal Medicine

## 2011-05-26 ENCOUNTER — Encounter: Payer: Self-pay | Admitting: Internal Medicine

## 2011-05-26 VITALS — BP 122/68 | HR 57 | Ht 67.0 in | Wt 124.0 lb

## 2011-05-26 DIAGNOSIS — F411 Generalized anxiety disorder: Secondary | ICD-10-CM

## 2011-05-26 DIAGNOSIS — I6529 Occlusion and stenosis of unspecified carotid artery: Secondary | ICD-10-CM

## 2011-05-26 DIAGNOSIS — F419 Anxiety disorder, unspecified: Secondary | ICD-10-CM

## 2011-05-26 DIAGNOSIS — R42 Dizziness and giddiness: Secondary | ICD-10-CM

## 2011-05-26 DIAGNOSIS — I495 Sick sinus syndrome: Secondary | ICD-10-CM

## 2011-05-26 NOTE — Assessment & Plan Note (Signed)
Followed in [place]. Given his progression, I think trying to get his LDL below goal would be appropriate. He's been taking redyeast no will try and take Crestor prescribed by Dr. Tomasa Blase.

## 2011-05-26 NOTE — Patient Instructions (Signed)
Your physician wants you to follow-up in: 1 year with Dr. Klein. You will receive a reminder letter in the mail two months in advance. If you don't receive a letter, please call our office to schedule the follow-up appointment.  Your physician recommends that you continue on your current medications as directed. Please refer to the Current Medication list given to you today.  

## 2011-05-26 NOTE — Progress Notes (Signed)
HPI  Alan [name] is a 78 y.o. male seen in followup for bradycardia and chronotropic incompetence giving rise to some degree of exercise intolerance. He has been seen both here and at [facility]. Most recently he was seen again there,he wore a heart rhythm monitor failed to detect any significant bradycardia. The plan has been to follow him forward waiting for symptoms.  Hecontniues to have some dizziness is largely nonpositoional  He also notes that he had carotid Dopplers done in [place] where he had mild obstructionHe has had subsequent studies done there. We will defer followup to there. There has apparently been some progression.  He also has a variety of other complaints including confusion, incontinence, poor focus. This seems to be temporally related to up titration of his Xanax from at bedtime to 3 times a day for anxiety. As he attributes to something "beyond him" Past Medical History  Diagnosis Date  . Hypertension   . Carotid stenosis     <60% 2012  . Bradycardia     chronotropic incompetence  . Exercise intolerance   . Diverticulosis   . Hemorrhoids   . Anemia   . Peptic ulcer disease   . Esophageal stricture   . Prostate cancer   . Vitamin B 12 deficiency   . Diabetes mellitus     Borderline  . Hernia   . Arthritis     Past Surgical History  Procedure Date  . Retropubic prostatectomy 2005    Radical  . Skin surgery     basal cell carcinoma    Current Outpatient Prescriptions  Medication Sig Dispense Refill  . ALPRAZolam (XANAX) 0.5 MG tablet Take one tablet by mouth three times a day      . aspirin 81 MG tablet Take 81 mg by mouth daily.        . cholecalciferol (VITAMIN D) 1000 UNITS tablet Take 2000 units daily      . Cinnamon 500 MG capsule Take 500 mg by mouth daily.      . Coenzyme Q10 (CO Q 10) 100 MG CAPS Take one by mouth twice daily       . cyanocobalamin (,VITAMIN B-12,) 1000 MCG/ML injection Take one injection once a month       .  Famotidine-Ca Carb-Mag Hydrox (PEPCID COMPLETE PO) Take one tablet a day as needed      . Flaxseed, Linseed, (FLAX SEED OIL PO) Take one tablespoon by mouth daily      . Folic Acid-Vit B6-Vit B12 (B COMPLEX-FOLIC ACID) 500-5-200 MCG-MG-MCG TABS Take 1 tablet by mouth daily.        . Garlic 600 MG CAPS Take 600 capsules by mouth.      . Omega-3 Fatty Acids (FISH OIL) 1000 MG CAPS Take one capsule two times a day       . psyllium (REGULOID) 0.52 G capsule Take 6 capsules by mouth three times a day      . Red Yeast Rice 600 MG CAPS Take two capsules by mouth daily      . Xylometazoline HCl (OTRIVIN NA) Nasal spray as needed twice daily         Allergies  Allergen Reactions  . Sulfamethoxazole     REACTION: throat swelled    Review of Systems negative except from HPI and PMH  Physical Exam BP 122/68  Pulse 57  Ht 5\' 7"  (1.702 m)  Wt 124 lb (56.246 kg)  BMI 19.42 kg/m2 Well developed and well nourished  in no acute distress HENT normal E scleral and icterus clear Neck Supple JVP flat; carotids brisk and full Clear to ausculation Regular rate and rhythm, no murmurs gallops or rub Soft with active bowel sounds No clubbing cyanosis none Edema Alert and oriented, grossly normal motor and sensory function Skin Warm and Dry  ECG demonstrates sinus rhythm at 57 Intervals 13/09/39 Active 18 Otherwise normal Assessment and  Plan

## 2011-05-26 NOTE — Assessment & Plan Note (Signed)
As above.

## 2011-05-26 NOTE — Assessment & Plan Note (Signed)
Patient is having a variety of neurological symptoms including nonfictional dizziness, confusion, tremor. I suggested that he see a neurologist. I will defer this to Dr. Tomasa Blase. Also raise the possibility that was related to his Xanax

## 2011-05-26 NOTE — Assessment & Plan Note (Signed)
Improved

## 2011-05-27 ENCOUNTER — Telehealth: Payer: Self-pay | Admitting: Internal Medicine

## 2011-05-27 NOTE — Telephone Encounter (Signed)
Please return call to patient wife Alan [name] regarding medication instructions. Patient will be home until 11 AM, will return after 2PM, she can be reached at [phone].

## 2011-05-27 NOTE — Telephone Encounter (Signed)
Pt's wife calling wanting clarification on med regime--she understood dr Graciela Husbands as telling pt to start crestor and d/c red yeast rice--after looking at note i agree with her--pt thought it was other way around--advised to start crestor today--wife agrees--nt

## 2012-07-06 ENCOUNTER — Ambulatory Visit (INDEPENDENT_AMBULATORY_CARE_PROVIDER_SITE_OTHER): Payer: Medicare Other | Admitting: Internal Medicine

## 2012-07-06 ENCOUNTER — Encounter: Payer: Self-pay | Admitting: Internal Medicine

## 2012-07-06 VITALS — BP 104/58 | HR 48 | Ht 67.0 in | Wt 125.0 lb

## 2012-07-06 DIAGNOSIS — I10 Essential (primary) hypertension: Secondary | ICD-10-CM

## 2012-07-06 DIAGNOSIS — R079 Chest pain, unspecified: Secondary | ICD-10-CM | POA: Insufficient documentation

## 2012-07-06 DIAGNOSIS — I495 Sick sinus syndrome: Secondary | ICD-10-CM

## 2012-07-06 NOTE — Patient Instructions (Addendum)
Your physician has requested that you have an exercise stress myoview. For further information please visit [email or web address] Please follow instruction sheet, as given.  Your physician recommends that you continue on your current medications as directed. Please refer to the Current Medication list given to you today.  Your physician wants you to follow-up in: 1 YEAR with Dr Graciela Husbands. You will receive a reminder letter in the mail two months in advance. If you don't receive a letter, please call our office to schedule the follow-up appointment.

## 2012-07-06 NOTE — Progress Notes (Signed)
Patient Care Team: Paulina Fusi as PCP - General (Internal Medicine)   HPI  Alan [name] is a 79 y.o. male seen in followup for bradycardia and chronotropic incompetence giving rise to some degree of exercise intolerance. He has been seen both here and at [facility]. Most recently he was seen again there,he wore a heart rhythm monitor failed to detect any significant bradycardia.   He's been complaining of more dizziness of late particularly with exertion. He is able to do a lot of what he wants with minimal restriction although there is accompanying fatigue. He has had no syncope. He also some orthostatic intolerance.  He has had on at least one occasion the episode of exertional chest discomfort in his mid left chest radiating to his shoulder and neck lasting a few minutes and relieved by rest. Recent laboratories demonstrate a good HDL  He has a history of abrupt in pulsation and recently underwent lifeline screening which was unrevealing       Past Medical History  Diagnosis Date  . Hypertension   . Carotid stenosis     <60% 2012  . Bradycardia     chronotropic incompetence  . Exercise intolerance   . Diverticulosis   . Hemorrhoids   . Anemia   . Peptic ulcer disease   . Esophageal stricture   . Prostate cancer   . Vitamin B 12 deficiency   . Diabetes mellitus     Borderline  . Hernia   . Arthritis   . Lightheadedness 10/14/2010  . Anxiety 07/19/2008    Qualifier: Diagnosis of  By: Wyn Quaker CMA, Marchelle Folks      Past Surgical History  Procedure Laterality Date  . Retropubic prostatectomy  2005    Radical  . Skin surgery      basal cell carcinoma    Current Outpatient Prescriptions  Medication Sig Dispense Refill  . ALPRAZolam (XANAX) 0.5 MG tablet 0.125 mg 3 (three) times daily as needed. Take one tablet by mouth three times a day      . aspirin 81 MG tablet Take 81 mg by mouth daily.        . Cholecalciferol (VITAMIN D PO) Take 2,000 Units by mouth daily.       . Cinnamon 500 MG capsule Take 500 mg by mouth daily.      . Coenzyme Q10 (CO Q 10) 100 MG CAPS Take one by mouth twice daily       . doxycycline (VIBRA-TABS) 100 MG tablet 100 mg 2 (two) times daily.       . Famotidine-Ca Carb-Mag Hydrox (PEPCID COMPLETE PO) Take one tablet a day as needed      . Folic Acid-Vit B6-Vit B12 (B COMPLEX-FOLIC ACID) 500-5-200 MCG-MG-MCG TABS Take 1 tablet by mouth daily.        . Garlic 600 MG CAPS Take 600 capsules by mouth.      . Omega-3 Fatty Acids (FISH OIL) 1000 MG CAPS Take one capsule two times a day       . psyllium (REGULOID) 0.52 G capsule Take 6 capsules by mouth three times a day      . rosuvastatin (CRESTOR) 5 MG tablet Take 5 mg by mouth daily.      . Xylometazoline HCl (OTRIVIN NA) Nasal spray as needed twice daily        No current facility-administered medications for this visit.    Allergies  Allergen Reactions  . Sulfamethoxazole     REACTION: throat  swelled    Review of Systems negative except from HPI and PMH  Physical Exam BP 104/58  Pulse 48  Ht 5\' 7"  (1.702 m)  Wt 125 lb (56.7 kg)  BMI 19.57 kg/m2 Well developed and nourished in no acute distress HENT normal Neck supple with JVP-flat Clear Regular rate and rhythm,S4 and an early systolic murmurAbd-soft with active BS No Clubbing cyanosis edema Skin-warm and dry A & Oriented  Grossly normal sensory and motor function  ECG demonstrates sinus rhythm at 48 Intervals 13/08/41 axis -20   Assessment and  Plan

## 2012-07-06 NOTE — Assessment & Plan Note (Addendum)
His resting bradycardia with heart rates into the 30s. He was admitted and recorded in the 42s. We'll undertake a stress test to assess for chronotropic competence. We discussed the possible role of pacing the need to implicate symptoms that are irreversible

## 2012-07-06 NOTE — Assessment & Plan Note (Signed)
He didn't has recurrent chest discomfort. He has no obstructive vascular disease and noncardiac meds. We'll undertake a stress test looking for both chronotropic incompetence as well as perfusion defects.

## 2012-07-13 ENCOUNTER — Ambulatory Visit (HOSPITAL_COMMUNITY): Payer: Medicare Other | Attending: Internal Medicine | Admitting: Radiology

## 2012-07-13 VITALS — BP 114/68 | Ht 67.0 in | Wt 122.0 lb

## 2012-07-13 DIAGNOSIS — Z8249 Family history of ischemic heart disease and other diseases of the circulatory system: Secondary | ICD-10-CM | POA: Insufficient documentation

## 2012-07-13 DIAGNOSIS — R5381 Other malaise: Secondary | ICD-10-CM | POA: Insufficient documentation

## 2012-07-13 DIAGNOSIS — I779 Disorder of arteries and arterioles, unspecified: Secondary | ICD-10-CM | POA: Insufficient documentation

## 2012-07-13 DIAGNOSIS — I495 Sick sinus syndrome: Secondary | ICD-10-CM

## 2012-07-13 DIAGNOSIS — E785 Hyperlipidemia, unspecified: Secondary | ICD-10-CM | POA: Insufficient documentation

## 2012-07-13 DIAGNOSIS — E119 Type 2 diabetes mellitus without complications: Secondary | ICD-10-CM | POA: Insufficient documentation

## 2012-07-13 DIAGNOSIS — R42 Dizziness and giddiness: Secondary | ICD-10-CM | POA: Insufficient documentation

## 2012-07-13 DIAGNOSIS — R5383 Other fatigue: Secondary | ICD-10-CM | POA: Insufficient documentation

## 2012-07-13 DIAGNOSIS — R079 Chest pain, unspecified: Secondary | ICD-10-CM | POA: Insufficient documentation

## 2012-07-13 DIAGNOSIS — I10 Essential (primary) hypertension: Secondary | ICD-10-CM | POA: Insufficient documentation

## 2012-07-13 MED ORDER — TECHNETIUM TC 99M SESTAMIBI GENERIC - CARDIOLITE
11.0000 | Freq: Once | INTRAVENOUS | Status: AC | PRN
  Administered 2012-07-13: 11 via INTRAVENOUS

## 2012-07-13 MED ORDER — TECHNETIUM TC 99M SESTAMIBI GENERIC - CARDIOLITE
33.0000 | Freq: Once | INTRAVENOUS | Status: AC | PRN
  Administered 2012-07-13: 33 via INTRAVENOUS

## 2012-07-13 MED ORDER — REGADENOSON 0.4 MG/5ML IV SOLN
0.4000 mg | Freq: Once | INTRAVENOUS | Status: AC
  Administered 2012-07-13: 0.4 mg via INTRAVENOUS

## 2012-07-13 NOTE — Progress Notes (Signed)
[facility] SITE 3 NUCLEAR MED [address] [place], [place] [address] [phone]    Cardiology Nuclear Med Study  Alan [name] is a 78 y.o. male     MRN : [id number]     DOB: 1931/10/21  Procedure Date: 07/13/2012  Nuclear Med Background Indication for Stress Test:  Evaluation for Ischemia and ETT to assess Chronotrophic Competence with Exercise History:  '09 ECHO: NL LVF, 10/16/09 MPS EF: 57% (Lexiscan) Cardiac Risk Factors: Carotid Disease, Family History - CAD, Hypertension, Lipids and NIDDM  Symptoms:  Chest Pain with Exertion, Dizziness, Fatigue with Exertion and Light-Headedness   Nuclear Pre-Procedure Caffeine/Decaff Intake:  None > 12 hrs NPO After: 7:30am   Lungs:  clear O2 Sat: 98% on room air. IV 0.9% NS with Angio Cath:  20g  IV Site: R Antecubital x 1, tolerated well IV Started by:  Irean Hong, RN  Chest Size (in):  38 Cup Size: n/a  Height: 5\' 7"  (1.702 m)  Weight:  122 lb (55.339 kg)  BMI:  Body mass index is 19.1 kg/(m^2). Tech Comments:  This patient walked 7:00 minutes on the treadmill, he stated he had to stop that he was very tired and so he was switched to a walking Lexiscan.    Nuclear Med Study 1 or 2 day study: 1 day  Stress Test Type:  Treadmill/Lexiscan  Reading MD: Cassell Clement, MD  Order Authorizing Provider:  Sherryl Manges, MD  Resting Radionuclide: Technetium 13m Sestamibi  Resting Radionuclide Dose: 11.0 mCi   Stress Radionuclide:  Technetium 35m Sestamibi  Stress Radionuclide Dose: 33.0 mCi           Stress Protocol Rest HR: 43 Stress HR: 103  Rest BP: 114/68 Stress BP: 159/66  Exercise Time (min): 9:20 METS: 8.20   Predicted Max HR: 141 bpm % Max HR: 73.05 bpm Rate Pressure Product: 46962   Dose of Adenosine (mg):  n/a Dose of Lexiscan: 0.4 mg  Dose of Atropine (mg): n/a Dose of Dobutamine: n/a mcg/kg/min (at max HR)  Stress Test Technologist: Milana Na, EMT-P  Nuclear Technologist:  Domenic Polite, CNMT      Rest Procedure:  Myocardial perfusion imaging was performed at rest 45 minutes following the intravenous administration of Technetium 53m Sestamibi. Rest ECG: NSR - Normal EKG  Stress Procedure:  The patient received IV Lexiscan 0.4 mg over 15-seconds with concurrent low level exercise and then Technetium 63m Sestamibi was injected at 30-seconds while the patient continued walking one more minute.  Quantitative spect images were obtained after a 45-minute delay. Stress ECG: No significant change from baseline ECG  QPS Raw Data Images:  Normal; no motion artifact; normal heart/lung ratio. Stress Images:  Normal homogeneous uptake in all areas of the myocardium. Rest Images:  Normal homogeneous uptake in all areas of the myocardium. Subtraction (SDS):  No evidence of ischemia. Transient Ischemic Dilatation (Normal <1.22):  1.02 Lung/Heart Ratio (Normal <0.45):  0.32  Quantitative Gated Spect Images QGS EDV:  77 ml QGS ESV:  29 ml  Impression Exercise Capacity:  Lexiscan with low level exercise. BP Response:  Normal blood pressure response. Clinical Symptoms:  The exercise was limited by fatigue. Walked 9:20 but unable to get heart rate above 103 so he was switched to [employer].. ECG Impression:  No significant ST segment change suggestive of ischemia. Comparison with Prior Nuclear Study: No images to compare  Overall Impression:  Low risk stress nuclear study . No ischemia. Patient demonstrates chronotropic incompetence.Marland Kitchen  LV Ejection Fraction: 62%.  LV Wall Motion:  NL LV Function; NL Wall Motion  [employer]

## 2012-07-20 ENCOUNTER — Telehealth: Payer: Self-pay | Admitting: Internal Medicine

## 2012-07-20 NOTE — Telephone Encounter (Signed)
Follow up  Pt's wife is returning a call from yesterday regarding her husband stress test.

## 2012-07-20 NOTE — Telephone Encounter (Signed)
Pt notified of stress test results.

## 2012-08-04 ENCOUNTER — Encounter: Payer: Self-pay | Admitting: *Deleted

## 2012-08-08 ENCOUNTER — Telehealth: Payer: Self-pay | Admitting: Internal Medicine

## 2012-08-08 NOTE — Telephone Encounter (Signed)
New Prob     Pts wife  would like to speak to nurse regarding a letter received for follow up appt. Please call.

## 2012-08-08 NOTE — Telephone Encounter (Signed)
Spoke with pt wife, aware of follow up appt.

## 2012-08-26 ENCOUNTER — Encounter: Payer: Self-pay | Admitting: Internal Medicine

## 2012-08-26 ENCOUNTER — Ambulatory Visit (INDEPENDENT_AMBULATORY_CARE_PROVIDER_SITE_OTHER): Payer: Medicare Other | Admitting: Internal Medicine

## 2012-08-26 VITALS — BP 102/60 | HR 52 | Ht 67.0 in | Wt 126.0 lb

## 2012-08-26 DIAGNOSIS — R079 Chest pain, unspecified: Secondary | ICD-10-CM

## 2012-08-26 DIAGNOSIS — R42 Dizziness and giddiness: Secondary | ICD-10-CM

## 2012-08-26 DIAGNOSIS — I495 Sick sinus syndrome: Secondary | ICD-10-CM

## 2012-08-26 NOTE — Progress Notes (Signed)
Patient Care Team: Paulina Fusi as PCP - General (Internal Medicine)   HPI  Alan [name] is a 79 y.o. male seen in followup for bradycardia and chronotropic incompetence giving rise to some degree of exercise intolerance. He has been seen both here and at [facility]. Most recently he was seen again there,he wore a heart rhythm monitor failed to detect any significant bradycardia.  He's been complaining of more dizziness of late particularly with standing, now this clearly evident with exertion he underwent stress testing which demonstrated no ischemia of chronotropic incompetence with a max predictable heart rate is 70%     Past Medical History  Diagnosis Date  . Hypertension   . Carotid stenosis     <60% 2012  . Bradycardia     chronotropic incompetence  . Exercise intolerance   . Diverticulosis   . Hemorrhoids   . Anemia   . Peptic ulcer disease   . Esophageal stricture   . Prostate cancer   . Vitamin B 12 deficiency   . Diabetes mellitus     Borderline  . Hernia   . Arthritis   . Lightheadedness 10/14/2010  . Anxiety 07/19/2008    Qualifier: Diagnosis of  By: Wyn Quaker CMA, Marchelle Folks      Past Surgical History  Procedure Laterality Date  . Retropubic prostatectomy  2005    Radical  . Skin surgery      basal cell carcinoma    Current Outpatient Prescriptions  Medication Sig Dispense Refill  . ALPRAZolam (XANAX) 0.5 MG tablet 0.125 mg 3 (three) times daily as needed. Take one tablet by mouth three times a day      . aspirin 81 MG tablet Take 81 mg by mouth daily.        . Cholecalciferol (VITAMIN D PO) Take 2,000 Units by mouth daily.      . Cinnamon 500 MG capsule Take 500 mg by mouth daily.      . Coenzyme Q10 (CO Q 10) 100 MG CAPS Take one by mouth twice daily       . Famotidine-Ca Carb-Mag Hydrox (PEPCID COMPLETE PO) Take one tablet a day as needed      . Folic Acid-Vit B6-Vit B12 (B COMPLEX-FOLIC ACID) 500-5-200 MCG-MG-MCG TABS Take 1 tablet by mouth  daily.        . Garlic 600 MG CAPS Take 600 capsules by mouth.      . Omega-3 Fatty Acids (FISH OIL) 1000 MG CAPS Take one capsule two times a day       . psyllium (REGULOID) 0.52 G capsule Take 6 capsules by mouth three times a day      . rosuvastatin (CRESTOR) 5 MG tablet Take 5 mg by mouth daily.      . Xylometazoline HCl (OTRIVIN NA) Nasal spray as needed twice daily        No current facility-administered medications for this visit.    Allergies  Allergen Reactions  . Sulfamethoxazole     REACTION: throat swelled  . Tekturna Hct (Aliskiren-Hydrochlorothiazide)     Lower back pain    Review of Systems negative except from HPI and PMH  Physical Exam BP 102/60  Pulse 52  Ht 5\' 7"  (1.702 m)  Wt 126 lb (57.153 kg)  BMI 19.73 kg/m2 Well developed and well nourished in no acute distress HENT normal E scleral and icterus clear Neck Supple JVP flat; carotids brisk and full Clear to ausculation Slow but  Regular rate and  rhythm, no murmurs gallops or rub Soft with active bowel sounds No clubbing cyanosis none Edema Alert and oriented, grossly normal motor and sensory function Skin Warm and Dry  ECG demonstrates sinus rhythm at 47 intervals 01/16/41 Otherwise normal  Assessment and  Plan

## 2012-08-26 NOTE — Assessment & Plan Note (Signed)
The patient has chronotropic incompetence. He is modestly symptomatic he actually did quite well accomplishing minute 8 on his standard treadmill.  He thinks he might be able to do more.

## 2012-08-26 NOTE — Patient Instructions (Addendum)
Your physician wants you to follow-up in: 6 MONTHS WITH DR KLEIN You will receive a reminder letter in the mail two months in advance. If you don't receive a letter, please call our office to schedule the follow-up appointment.  

## 2012-08-26 NOTE — Assessment & Plan Note (Signed)
I suspect that this is largely related to orthostasis. Is not clear to me that it is related to bradycardia. We have reviewed isometric maneuvers to try to minimize some of his symptoms. If

## 2012-08-26 NOTE — Assessment & Plan Note (Signed)
A Myoview by my review was normal

## 2012-09-13 ENCOUNTER — Telehealth: Payer: Self-pay | Admitting: Internal Medicine

## 2012-09-13 NOTE — Telephone Encounter (Signed)
New problem   pts wife calling about stress test results that was done several weeks ago

## 2012-09-13 NOTE — Telephone Encounter (Signed)
Pt states that she was told her husband would receive results from stress myo/ results had been mailed and they received them.  Reviewed results and she verbalized understanding, Declines app for pacemaker at this time, told her we will wait for her to call to proceed further. Agreed to plan.

## 2012-11-08 DIAGNOSIS — I499 Cardiac arrhythmia, unspecified: Secondary | ICD-10-CM | POA: Insufficient documentation

## 2013-06-13 ENCOUNTER — Ambulatory Visit (INDEPENDENT_AMBULATORY_CARE_PROVIDER_SITE_OTHER): Payer: Medicare Other | Admitting: Neurology

## 2013-06-13 ENCOUNTER — Other Ambulatory Visit (INDEPENDENT_AMBULATORY_CARE_PROVIDER_SITE_OTHER): Payer: Self-pay

## 2013-06-13 ENCOUNTER — Encounter: Payer: Self-pay | Admitting: Neurology

## 2013-06-13 ENCOUNTER — Encounter (INDEPENDENT_AMBULATORY_CARE_PROVIDER_SITE_OTHER): Payer: Self-pay

## 2013-06-13 VITALS — BP 102/54 | HR 53 | Ht 67.0 in | Wt 127.0 lb

## 2013-06-13 DIAGNOSIS — Z0289 Encounter for other administrative examinations: Secondary | ICD-10-CM

## 2013-06-13 DIAGNOSIS — R413 Other amnesia: Secondary | ICD-10-CM | POA: Insufficient documentation

## 2013-06-14 DIAGNOSIS — Z950 Presence of cardiac pacemaker: Secondary | ICD-10-CM | POA: Insufficient documentation

## 2013-06-14 LAB — THYROID PANEL WITH TSH
Free Thyroxine Index: 1.3 (ref 1.2–4.9)
T3 UPTAKE RATIO: 30 % (ref 24–39)
T4 TOTAL: 4.2 ug/dL — AB (ref 4.5–12.0)
TSH: 0.981 u[IU]/mL (ref 0.450–4.500)

## 2013-06-14 LAB — ANA W/REFLEX IF POSITIVE: Anti Nuclear Antibody(ANA): NEGATIVE

## 2013-06-14 LAB — C-REACTIVE PROTEIN: CRP: 1.4 mg/L (ref 0.0–4.9)

## 2013-06-14 LAB — SEDIMENTATION RATE: Sed Rate: 11 mm/hr (ref 0–30)

## 2013-06-15 NOTE — Progress Notes (Signed)
Quick Note:  Left message with lab results, per Dr. Krista Blue. Told to call with any questions. ______

## 2013-06-16 NOTE — Progress Notes (Signed)
PATIENT: Alan [name] DOB: [birth year]  HISTORICAL  ONTARIO PETTENGILL is 80 years old right-handed Caucasian male, accompanied by his wife of 76 years, referred by his primary care physician from [place], Dr. Burnett Sheng, Nathaneil Canary for evaluation of mild memory trouble  He had past medical history of anxiety, hyperlipidemia, iron deficiency in the anemia in the past, prediabetes, lumbar degenerative disorder, presenting with gradual onset of mild memory loss  He retired at age 67 from [employer], has been inactive over the past 16 years, he is still active in his church, and at home, wife denies him having any significant difficulties, himself feels frustrated over the past for 5 years, since 2010, he noticed, he tends to misplace things, forgot name, forgot that he has written a check, he denies significant gait difficulty, still drives without any difficulty,  He was diagnosed with sick sinus syndrome, is going to have pacemaker placement soon  Previous MRI brain in 2010 No acute intracranial abnormality. Minimal central pontine and subcortical white matter disease. This is nonspecific, but likely reflects the sequelae of chronic microvascular ischemia.  Laboratory evaluation in April 2015, normal CBC,  MRI of brain April 2012, within normal limit for age,  MRI lumbar June 2011, no significant change compared with previous MRI in 2 7 and 9, mild degenerative change at L3-4, 4 5, 5 S1,  CT angiogram January 2015, calcified plaque at both carotid bifurcation, 30% stenosis on the right side internal carotid artery, 20% stenosis at left internal carotid artery, 50% stenosis of right vertebral artery, 50% stenosis of mid basilar artery,  CT abdomen February 2014, nonspecific mild thickening of the transverse colon wall, subtle enhancement of the mucous, suspicious for mild colitis, there was a cyst in the lower pole of the right kidney, measuring 1.3 cm, no hydrocephalus, or  hydroureteral, atherosclerotic calcification of the abdominal aorta and iliac arteries    REVIEW OF SYSTEMS: Full 14 system review of systems performed and notable only for fatigue, palpation, hearing loss, ringing ears, trouble swallowing, blurry vision, double vision, loss of vision, incontinence, urination problem, easy bruising, easy bleeding, energy, running nose, memory loss, confusion, weakness, dizziness, insomnia, depression, anxiety, not enough sleep, decreased energy, this interesting activities, racing thoughts,  ALLERGIES: Allergies  Allergen Reactions  . Sulfamethoxazole     REACTION: throat swelled  . Tekturna Hct [Aliskiren-Hydrochlorothiazide]     Lower back pain    HOME MEDICATIONS: Current Outpatient Prescriptions on File Prior to Visit  Medication Sig Dispense Refill  . ALPRAZolam (XANAX) 0.5 MG tablet 0.125 mg 3 (three) times daily as needed. Take one tablet by mouth three times a day      . aspirin 81 MG tablet Take 81 mg by mouth daily.        . Cholecalciferol (VITAMIN D PO) Take 2,000 Units by mouth daily.      . Cinnamon 500 MG capsule Take 500 mg by mouth daily.      . Coenzyme Q10 (CO Q 10) 100 MG CAPS Take one by mouth twice daily       . Garlic 856 MG CAPS Take 600 capsules by mouth.      . Omega-3 Fatty Acids (FISH OIL) 1000 MG CAPS Take one capsule two times a day       . psyllium (REGULOID) 0.52 G capsule Take 6 capsules by mouth three times a day      . Xylometazoline HCl (OTRIVIN NA) Nasal spray as needed twice  daily        No current facility-administered medications on file prior to visit.    PAST MEDICAL HISTORY: Past Medical History  Diagnosis Date  . Hypertension   . Carotid stenosis     <60% 2012  . Bradycardia     chronotropic incompetence  . Exercise intolerance   . Diverticulosis   . Hemorrhoids   . Anemia   . Peptic ulcer disease   . Esophageal stricture   . Prostate cancer   . Vitamin B 12 deficiency   . Diabetes mellitus       Borderline  . Hernia   . Arthritis   . Lightheadedness 10/14/2010  . Anxiety 07/19/2008    Qualifier: Diagnosis of  By: Hollie Salk CMA, Estill Bamberg      PAST SURGICAL HISTORY: Past Surgical History  Procedure Laterality Date  . Retropubic prostatectomy  2005    Radical  . Skin surgery      basal cell carcinoma    FAMILY HISTORY: Family History  Problem Relation Age of Onset  . Heart disease Mother   . Cancer Father     prostate  . Cancer Paternal Grandfather     prostate  . Stroke Father   . Stroke Mother     SOCIAL HISTORY:  History   Social History  . Marital Status: Married    Spouse Name: N/A    Number of Children: 1  . Years of Education: N/A   Occupational History  . Retired     Chief Financial Officer   Social History Main Topics  . Smoking status: Never Smoker   . Smokeless tobacco: Not on file  . Alcohol Use: No  . Drug Use: No  . Sexual Activity: Not on file   Other Topics Concern  . Not on file   Social History Narrative   Married, 1 son   Right handed   BS    No caffeine     PHYSICAL EXAM   Filed Vitals:   06/13/13 1035  BP: 102/54  Pulse: 53  Height: 5\' 7"  (1.702 m)  Weight: 127 lb (57.607 kg)    Not recorded    Body mass index is 19.89 kg/(m^2).   Generalized: In no acute distress  Neck: Supple, no carotid bruits   Cardiac: Regular rate rhythm  Pulmonary: Clear to auscultation bilaterally  Musculoskeletal: No deformity  Neurological examination  Mentation: Pleasant elderly gentleman in no acute distress, Mini-Mental Status Examination is 28 out of 30, he has not oriented to date, missed 1 out of 3 recalls,  Cranial nerve II-XII: Pupils were equal round reactive to light. Extraocular movements were full.  Visual field were full on confrontational test. Bilateral fundi were sharp.  Facial sensation and strength were normal. Hearing was intact to finger rubbing bilaterally. Uvula tongue midline.  Head turning and shoulder shrug and were  normal and symmetric.Tongue protrusion into cheek strength was normal.  Motor: Normal tone, bulk and strength.  Sensory: Intact to fine touch, pinprick, preserved vibratory sensation, and proprioception at toes.  Coordination: Normal finger to nose, heel-to-shin bilaterally there was no truncal ataxia  Gait: Rising up from seated position without assistance, stooped forward posture, without trunk ataxia, moderate stride, good arm swing, smooth turning, able to perform tiptoe, and heel walking without difficulty.   Romberg signs: Negative  Deep tendon reflexes: Brachioradialis 2/2, biceps 2/2, triceps 2/2, patellar 2/2, Achilles 2/2, plantar responses were flexor bilaterally.   DIAGNOSTIC DATA (LABS, IMAGING, TESTING) - I reviewed patient  records, labs, notes, testing and imaging myself where available.  Lab Results  Component Value Date   WBC 5.68 10/01/2009   HGB 13.8 10/01/2009   HCT 40.3 10/01/2009   MCV 91.2 10/01/2009   PLT 207 10/01/2009      Component Value Date/Time   NA 138 10/01/2009   K 4.5 10/01/2009   CL 103 10/01/2009   CO2 25 10/01/2009   GLUCOSE 104 10/01/2009   BUN 26 10/01/2009   CREATININE 1.2 10/01/2009   CALCIUM 8.6 10/01/2009   PROT 6.7 10/01/2009   ALBUMIN 3.6 10/01/2009   AST 19 10/01/2009   ALT 26 10/01/2009   ALKPHOS 107 10/01/2009   BILITOT 0.2 10/01/2009   GFRNONAA >60 01/22/2009   GFRAA >60 01/22/2009   Lab Results  Component Value Date   CHOL 202 10/01/2009   HDL 59 10/01/2009   LDLCALC 111 10/01/2009   Lab Results  Component Value Date   HGBA1C 5.8 03/17/2010   No results found for this basename: [id number]   Lab Results  Component Value Date   TSH 0.981 06/13/2013      ASSESSMENT AND PLAN  NIGIL BRAMAN is a 80 y.o. male complains of  mild memory trouble, still highly functional, Mini-Mental Status 28 out of 30, he is going to have pacemaker for his arrhythmia, documented history of bradycardia, not a good candidate for  Aricept, he does not want  to take any medication at this point,   Laboratory evaluation including B12, thyroid function test, I will call him result, only return to clinic for new issues, or worsening memory trouble        Marcial Pacas, M.D. Ph.D.  [facility] [address] [place], [place] [address] [phone]

## 2013-06-19 ENCOUNTER — Telehealth: Payer: Self-pay | Admitting: *Deleted

## 2013-06-19 NOTE — Telephone Encounter (Signed)
Patient's sps returning calling regarding results..Thanks

## 2013-06-21 NOTE — Telephone Encounter (Signed)
Patient's wife calling to request results, says she has been waiting for several days now. Please call and advise.

## 2013-06-21 NOTE — Telephone Encounter (Signed)
Results were called to patient by Geronimo Running on

## 2013-06-23 NOTE — Telephone Encounter (Signed)
Spoke to patient's wife and she was requesting copies of lab results.  I had previously relayed the results by phone.

## 2013-12-04 DIAGNOSIS — N481 Balanitis: Secondary | ICD-10-CM | POA: Insufficient documentation

## 2014-05-24 DIAGNOSIS — R32 Unspecified urinary incontinence: Secondary | ICD-10-CM | POA: Insufficient documentation

## 2014-06-07 ENCOUNTER — Telehealth: Payer: Self-pay | Admitting: Internal Medicine

## 2014-06-07 NOTE — Telephone Encounter (Signed)
Spoke with patient and his wife on telephone.  Reports reoccurrence of dizziness/fatigue over the last few weeks and it seems to be worsening.  Patient denies syncope/falls.  Reports head cold a few weeks ago. Patient is currently being followed by Dr. Norville Haggard at [facility] and hasn't seen Dr. Caryl Comes since 08/2012.   Advised to contact Dr. Rosita Fire so that he may be evaluated and device checked.   Informed them that we would be more than happy to arrange an office visit with Dr. Caryl Comes to re-establish care and patient states he will call back to arrange this.

## 2014-06-07 NOTE — Telephone Encounter (Signed)
Patient c/o Palpitations:  High priority if patient c/o lightheadedness and shortness of breath.  1. How long have you been having palpitations? Yes   2. Are you currently experiencing lightheadedness and shortness of breath? Lightheadedness only   3. Have you checked your BP and heart rate? (document readings)   4/26 169/85 @ 9:15am 4/26 142/71 @ 11 am   04/27 145/85 @ 9:30am  No other readings at this time   4. Are you experiencing any other symptoms? Dizzy, feels weak. Heart feels as if it is beating fairly hard.  Wife called to report this.

## 2014-09-17 DIAGNOSIS — N281 Cyst of kidney, acquired: Secondary | ICD-10-CM | POA: Insufficient documentation

## 2014-10-30 ENCOUNTER — Encounter: Payer: Self-pay | Admitting: Internal Medicine

## 2014-11-13 ENCOUNTER — Encounter: Payer: Self-pay | Admitting: Internal Medicine

## 2014-11-13 ENCOUNTER — Ambulatory Visit (INDEPENDENT_AMBULATORY_CARE_PROVIDER_SITE_OTHER): Payer: Medicare Other | Admitting: Internal Medicine

## 2014-11-13 VITALS — BP 124/80 | HR 60 | Ht 67.0 in | Wt 136.2 lb

## 2014-11-13 DIAGNOSIS — Z950 Presence of cardiac pacemaker: Secondary | ICD-10-CM | POA: Diagnosis not present

## 2014-11-13 DIAGNOSIS — R001 Bradycardia, unspecified: Secondary | ICD-10-CM

## 2014-11-13 DIAGNOSIS — I495 Sick sinus syndrome: Secondary | ICD-10-CM

## 2014-11-13 LAB — CUP PACEART INCLINIC DEVICE CHECK
Brady Statistic RA Percent Paced: 81 %
Brady Statistic RV Percent Paced: 2 %
Lead Channel Impedance Value: 429 Ohm
Lead Channel Pacing Threshold Amplitude: 1.1 V
Lead Channel Pacing Threshold Pulse Width: 0.4 ms
Lead Channel Sensing Intrinsic Amplitude: 2.8 mV
Lead Channel Setting Pacing Pulse Width: 0.4 ms
MDC IDC MSMT LEADCHNL RA PACING THRESHOLD AMPLITUDE: 0.9 V
MDC IDC MSMT LEADCHNL RA PACING THRESHOLD PULSEWIDTH: 0.4 ms
MDC IDC MSMT LEADCHNL RV IMPEDANCE VALUE: 732 Ohm
MDC IDC MSMT LEADCHNL RV SENSING INTR AMPL: 25 mV — AB
MDC IDC PG SERIAL: 101958
MDC IDC SESS DTM: 20161004173101
MDC IDC SET LEADCHNL RA PACING AMPLITUDE: 2 V
MDC IDC SET LEADCHNL RV PACING AMPLITUDE: 2.4 V
MDC IDC SET LEADCHNL RV SENSING SENSITIVITY: 2.5 mV

## 2014-11-13 MED ORDER — ATENOLOL 25 MG PO TABS: 25.0000 mg | ORAL_TABLET | Freq: Every day | ORAL | Status: DC

## 2014-11-13 NOTE — Patient Instructions (Signed)
Medication Instructions: 1) Start Atenolol 25 mg one tablet by mouth daily at bedtime  Labwork: - none  Procedures/Testing: - none  Follow-Up: - Your physician wants you to follow-up in: 4 months with Dr. Caryl Comes. You will receive a reminder letter in the mail two months in advance. If you don't receive a letter, please call our office to schedule the follow-up appointment.  Any Additional Special Instructions Will Be Listed Below (If Applicable). - none

## 2014-11-13 NOTE — Progress Notes (Signed)
Patient Care Team: Nicoletta Dress, MD as PCP - General (Internal Medicine)   HPI  Alan [name] is a 81 y.o. male Seen in follow-up for sinus bradycardia and chronotropic incompetence. He been seen here at [facility]. Multiple discussions event had regarding pacing and he has deferred over the years. We last saw him in 2014  at [facility] he received a Chiropractor. He comes in with complaints related to its function. He describes nocturnal palpitations and early a.m. Hypertension.  His wife's comment is that he just doesn't like it all Records and Results Reviewe  Richardean Canal  notes  Past Medical History  Diagnosis Date  . Hypertension   . Carotid stenosis     <60% 2012  . Bradycardia     chronotropic incompetence  . Exercise intolerance   . Diverticulosis   . Hemorrhoids   . Anemia   . Peptic ulcer disease   . Esophageal stricture   . Prostate cancer ([place])   . Vitamin B 12 deficiency   . Diabetes mellitus     Borderline  . Hernia   . Arthritis   . Lightheadedness 10/14/2010  . Anxiety 07/19/2008    Qualifier: Diagnosis of  By: Hollie Salk CMA, Estill Bamberg      Past Surgical History  Procedure Laterality Date  . Retropubic prostatectomy  2005    Radical  . Skin surgery      basal cell carcinoma    Current Outpatient Prescriptions  Medication Sig Dispense Refill  . ALPRAZolam (XANAX) 0.25 MG tablet Take 1 tablet by mouth at bedtime as needed. May take another tablet by mouth if awakens during the night  5  . aspirin 81 MG tablet Take 81 mg by mouth daily.      . Cholecalciferol (VITAMIN D PO) Take 2,000 Units by mouth daily.    . CHROMIUM PO Take 1 tablet by mouth daily.     . Cinnamon 500 MG capsule Take 500 mg by mouth daily.    . Coenzyme Q10 (CO Q 10) 100 MG CAPS Take 1 capsule by mouth daily. Take one by mouth twice daily    . CRESTOR 20 MG tablet Take 5 mg by mouth daily.  3  . Cyanocobalamin (B-12 PO) Take 1 capsule by mouth daily.     Marland Kitchen  donepezil (ARICEPT) 5 MG tablet Take 5 mg by mouth daily.  12  . fluticasone (FLONASE) 50 MCG/ACT nasal spray Place 2 sprays into both nostrils at bedtime.  11  . Garlic 419 MG CAPS Take 600 mg by mouth daily.     Marland Kitchen MAGNESIUM PO Take 1 tablet by mouth daily.     . Omega-3 Fatty Acids (FISH OIL) 1000 MG CAPS Take one capsule two times a day     . psyllium (REGULOID) 0.52 G capsule Take 6 capsules by mouth three times a day    . sertraline (ZOLOFT) 50 MG tablet Take 50 mg by mouth daily.  0  . Xylometazoline HCl (OTRIVIN NA) Place 2 sprays into both nostrils daily as needed (nasal congestion). Nasal spray as needed twice daily     No current facility-administered medications for this visit.    Allergies  Allergen Reactions  . Sulfamethoxazole     REACTION: throat swelled  . Tekturna Hct [Aliskiren-Hydrochlorothiazide]     Lower back pain      Review of Systems negative except from HPI and PMH  Physical Exam BP 124/80 mmHg  Pulse 60  Ht 5\' 7"  (1.702 m)  Wt 136 lb 3.2 oz (61.78 kg)  BMI 21.33 kg/m2 Well developed and well nourished in no acute distress HENT normal E scleral and icterus clear Neck Supple JVP flat; carotids brisk and full Clear to ausculation  Regular rate and rhythm, no murmurs gallops or rub Soft with active bowel sounds No clubbing cyanosis  Edema Alert and oriented, grossly normal motor and sensory function Skin Warm and Dry  ECG demonstrates atrial pacing at 60 Intervals 18/08/38  Assessment and  Plan  Sinus node dysfunction  Pacemaker-[employer]  hypertension-a.m.    He has a multitude of complaints and is not quite sure what he wants to do. We discussed various strategies of device reprogramming including turning off his minute ventilation sensor which may be contributing to his nocturnal symptoms, turning off his rate response altogether decreasing his resting rate to 40. At this juncture he would like to repeat his pacemaker  untouched.  He has a.m. hypertension nocturnal palpitations. We will try him on a low-dose beta blocker to be taken at night.

## 2015-01-17 ENCOUNTER — Encounter: Payer: Self-pay | Admitting: Internal Medicine

## 2015-02-19 ENCOUNTER — Encounter: Payer: Self-pay | Admitting: Internal Medicine

## 2015-03-22 DIAGNOSIS — C61 Malignant neoplasm of prostate: Secondary | ICD-10-CM | POA: Insufficient documentation

## 2015-03-28 ENCOUNTER — Encounter: Payer: Self-pay | Admitting: Internal Medicine

## 2015-03-28 ENCOUNTER — Ambulatory Visit (INDEPENDENT_AMBULATORY_CARE_PROVIDER_SITE_OTHER): Payer: Medicare Other | Admitting: Internal Medicine

## 2015-03-28 VITALS — BP 90/54 | HR 60 | Ht 67.0 in | Wt 135.6 lb

## 2015-03-28 DIAGNOSIS — I495 Sick sinus syndrome: Secondary | ICD-10-CM

## 2015-03-28 DIAGNOSIS — Z950 Presence of cardiac pacemaker: Secondary | ICD-10-CM | POA: Diagnosis not present

## 2015-03-28 LAB — CUP PACEART INCLINIC DEVICE CHECK
Date Time Interrogation Session: 20170216050000
Implantable Lead Implant Date: 20150506
Implantable Lead Location: 753860
Implantable Lead Model: 4086
Implantable Lead Serial Number: 265257
Lead Channel Impedance Value: 756 Ohm
Lead Channel Pacing Threshold Amplitude: 1 V
Lead Channel Pacing Threshold Pulse Width: 0.4 ms
Lead Channel Pacing Threshold Pulse Width: 0.6 ms
Lead Channel Sensing Intrinsic Amplitude: 25 mV
Lead Channel Setting Pacing Amplitude: 2 V
Lead Channel Setting Sensing Sensitivity: 2.5 mV
MDC IDC LEAD IMPLANT DT: 20150506
MDC IDC LEAD LOCATION: 753859
MDC IDC LEAD SERIAL: 310823
MDC IDC MSMT LEADCHNL RA IMPEDANCE VALUE: 429 Ohm
MDC IDC MSMT LEADCHNL RA PACING THRESHOLD AMPLITUDE: 1 V
MDC IDC MSMT LEADCHNL RA SENSING INTR AMPL: 4 mV
MDC IDC SET LEADCHNL RV PACING AMPLITUDE: 2.4 V
MDC IDC SET LEADCHNL RV PACING PULSEWIDTH: 0.4 ms
Pulse Gen Serial Number: 101958

## 2015-03-28 NOTE — Patient Instructions (Addendum)
Medication Instructions: - Your physician recommends that you continue on your current medications as directed. Please refer to the Current Medication list given to you today.  Labwork: - none  Procedures/Testing: - none  Follow-Up: - Your physician recommends that you schedule a follow-up appointment in: 4 months with Dr. Caryl Comes.   Any Additional Special Instructions Will Be Listed Below (If Applicable).     If you need a refill on your cardiac medications before your next appointment, please call your pharmacy.

## 2015-03-28 NOTE — Progress Notes (Signed)
Patient Care Team: Nicoletta Dress, MD as PCP - General (Internal Medicine)   HPI  Alan [name] is a 82 y.o. male Seen in follow-up for sinus bradycardia and chronotropic incompetence. He been seen here at [facility]. Multiple discussions event had regarding pacing and he has deferred over the years. We last saw him in 2014  at [facility] he received a Chiropractor.    His major concern is that his blood pressures in the evening and morning are about 160. Today can be 100-120. He has some daytime lightheadedness. He has had no syncope or presyncope.    0.4 inches  Past Medical History  Diagnosis Date  . Hypertension   . Carotid stenosis     <60% 2012  . Bradycardia     chronotropic incompetence  . Exercise intolerance   . Diverticulosis   . Hemorrhoids   . Anemia   . Peptic ulcer disease   . Esophageal stricture   . Prostate cancer ([place])   . Vitamin B 12 deficiency   . Diabetes mellitus     Borderline  . Hernia   . Arthritis   . Lightheadedness 10/14/2010  . Anxiety 07/19/2008    Qualifier: Diagnosis of  By: Hollie Salk CMA, Estill Bamberg      Past Surgical History  Procedure Laterality Date  . Retropubic prostatectomy  2005    Radical  . Skin surgery      basal cell carcinoma    Current Outpatient Prescriptions  Medication Sig Dispense Refill  . ALPRAZolam (XANAX) 0.25 MG tablet Take 1 tablet by mouth at bedtime as needed. May take another tablet by mouth if awakens during the night  5  . amitriptyline (ELAVIL) 25 MG tablet Take 25 mg by mouth at bedtime.    Marland Kitchen aspirin 81 MG tablet Take 81 mg by mouth daily.      Marland Kitchen atenolol (TENORMIN) 25 MG tablet Take 1 tablet (25 mg total) by mouth daily. 30 tablet 6  . Cholecalciferol (VITAMIN D PO) Take 2,000 Units by mouth daily.    . Cinnamon 500 MG capsule Take 500 mg by mouth daily.    . Coenzyme Q10 (CO Q 10) 100 MG CAPS Take 1 capsule by mouth daily. Take one by mouth twice daily    . CRESTOR 20 MG  tablet Take 5 mg by mouth daily.  3  . Cyanocobalamin (B-12 PO) Take 1 capsule by mouth daily.     Marland Kitchen donepezil (ARICEPT) 5 MG tablet Take 5 mg by mouth daily.  12  . Garlic 0000000 MG CAPS Take 600 mg by mouth daily.     Marland Kitchen MAGNESIUM PO Take 1 tablet by mouth daily.     . Omega-3 Fatty Acids (FISH OIL) 1000 MG CAPS Take one capsule two times a day     . psyllium (REGULOID) 0.52 G capsule Take 6 capsules by mouth three times a day    . Xylometazoline HCl (OTRIVIN NA) Place 2 sprays into both nostrils daily as needed (nasal congestion). Nasal spray as needed twice daily     No current facility-administered medications for this visit.    Allergies  Allergen Reactions  . Sulfa Antibiotics Other (See Comments) and Swelling  . Amlodipine Besylate Swelling    Swelling of scrotum Swelling of scrotum  . Aliskiren Other (See Comments)  . Penicillins Other (See Comments)  . Sulfamethoxazole     REACTION: throat swelled  . Tekturna Hct [Aliskiren-Hydrochlorothiazide]  Lower back pain      Review of Systems negative except from HPI and PMH  Physical Exam BP 90/54 mmHg  Pulse 60  Ht 5\' 7"  (1.702 m)  Wt 135 lb 9.6 oz (61.508 kg)  BMI 21.23 kg/m2 Well developed and well nourished in no acute distress HENT normal E scleral and icterus clear Neck Supple JVP flat; carotids brisk and full Clear to ausculation  Regular rate and rhythm, no murmurs gallops or rub Soft with active bowel sounds No clubbing cyanosis  Edema Alert and oriented, grossly normal motor and sensory function Skin Warm and Dry  ECG demonstrates atrial pacing at 60 Intervals 18/08/38  Assessment and  Plan  Sinus node dysfunction  Pacemaker-[employer]  hypertension-a.m.   We have discussed at length the issue of diurnal variation in blood pressure. With daytime blood pressures in the 100 range and recumbent blood pressures in the 160 range a few different strategies present themselves. The first is the  use of Mestinon to try to protect against daytime hypotension. Another would be to use a short acting beta blocker at night i.e. metoprolol tartrate with the hopes that it would be out of his system during the day. Another adjunctive approach would be to raise the head of his bed at night both to improve orthostatic intolerance during the day as well as to perhaps decrease nocturnal hypertension. He is not quite sure what his needs he would like to do.

## 2015-05-28 ENCOUNTER — Other Ambulatory Visit: Payer: Self-pay

## 2015-05-28 MED ORDER — ATENOLOL 25 MG PO TABS: 25.0000 mg | ORAL_TABLET | Freq: Every day | ORAL | Status: DC

## 2015-05-28 NOTE — Telephone Encounter (Signed)
Alan Sprang, MD at 03/28/2015 2:17 PM atenolol (TENORMIN) 25 MG tabletTake 1 tablet (25 mg total) by mouth daily Patient Instructions     Medication Instructions: - Your physician recommends that you continue on your current medications as directed. Please refer to the Current Medication list given to you today.

## 2015-07-10 ENCOUNTER — Encounter: Payer: Self-pay | Admitting: Internal Medicine

## 2015-07-30 ENCOUNTER — Ambulatory Visit (INDEPENDENT_AMBULATORY_CARE_PROVIDER_SITE_OTHER): Payer: Medicare Other | Admitting: Internal Medicine

## 2015-07-30 ENCOUNTER — Encounter: Payer: Self-pay | Admitting: Internal Medicine

## 2015-07-30 VITALS — BP 118/66 | HR 61 | Ht 67.0 in | Wt 143.0 lb

## 2015-07-30 DIAGNOSIS — Z950 Presence of cardiac pacemaker: Secondary | ICD-10-CM

## 2015-07-30 DIAGNOSIS — I495 Sick sinus syndrome: Secondary | ICD-10-CM

## 2015-07-30 LAB — CUP PACEART INCLINIC DEVICE CHECK
Implantable Lead Implant Date: 20150506
Implantable Lead Model: 4086
Implantable Lead Model: 4087
Implantable Lead Serial Number: 265257
Implantable Lead Serial Number: 310823
Lead Channel Impedance Value: 796 Ohm
Lead Channel Pacing Threshold Amplitude: 0.9 V
Lead Channel Pacing Threshold Amplitude: 1.3 V
Lead Channel Pacing Threshold Pulse Width: 0.4 ms
Lead Channel Pacing Threshold Pulse Width: 0.4 ms
Lead Channel Sensing Intrinsic Amplitude: 23.9 mV
Lead Channel Setting Pacing Amplitude: 2 V
Lead Channel Setting Pacing Amplitude: 2.4 V
Lead Channel Setting Sensing Sensitivity: 2.5 mV
MDC IDC LEAD IMPLANT DT: 20150506
MDC IDC LEAD LOCATION: 753859
MDC IDC LEAD LOCATION: 753860
MDC IDC MSMT LEADCHNL RA IMPEDANCE VALUE: 426 Ohm
MDC IDC MSMT LEADCHNL RA SENSING INTR AMPL: 3.9 mV
MDC IDC PG SERIAL: 101958
MDC IDC SESS DTM: 20170620040000
MDC IDC SET LEADCHNL RV PACING PULSEWIDTH: 0.4 ms

## 2015-07-30 NOTE — Patient Instructions (Signed)

## 2015-07-30 NOTE — Progress Notes (Signed)
Patient Care Team: Alan Dress, MD as PCP - General (Internal Medicine)   HPI  Alan [name] is a 82 y.o. male Seen in follow-up for sinus bradycardia and chronotropic incompetence. He been seen here at [facility]. Multiple discussions event had regarding pacing and he has deferred over the years. We last saw him in 2014  at [facility] he received a Chiropractor.    He is fatiuged and he is now seeing [facility] where they are doing an evaluation for sleep and depression -- he thnks the depression is the major problem  He was noted to hve low BP and his atenolol 25>>12.5  Labs swere reviewed and were normla       Past Medical History  Diagnosis Date  . Hypertension   . Carotid stenosis     <60% 2012  . Bradycardia     chronotropic incompetence  . Exercise intolerance   . Diverticulosis   . Hemorrhoids   . Anemia   . Peptic ulcer disease   . Esophageal stricture   . Prostate cancer ([place])   . Vitamin B 12 deficiency   . Diabetes mellitus     Borderline  . Hernia   . Arthritis   . Lightheadedness 10/14/2010  . Anxiety 07/19/2008    Qualifier: Diagnosis of  By: Hollie Salk CMA, Estill Bamberg      Past Surgical History  Procedure Laterality Date  . Retropubic prostatectomy  2005    Radical  . Skin surgery      basal cell carcinoma    Current Outpatient Prescriptions  Medication Sig Dispense Refill  . ALPRAZolam (XANAX) 0.25 MG tablet Take 1 tablet by mouth at bedtime as needed. May take another tablet by mouth if awakens during the night  5  . amitriptyline (ELAVIL) 25 MG tablet Take 25 mg by mouth at bedtime.    Marland Kitchen aspirin 81 MG tablet Take 81 mg by mouth daily.      Marland Kitchen atenolol (TENORMIN) 25 MG tablet Take 1 tablet (25 mg total) by mouth daily. 30 tablet 6  . atenolol (TENORMIN) 25 MG tablet Take 12.5 mg by mouth daily. Take 0.5 tablet daily    . Cholecalciferol (VITAMIN D PO) Take 2,000 Units by mouth daily.    . Cinnamon 500 MG capsule  Take 500 mg by mouth daily.    . Coenzyme Q10 (CO Q 10) 100 MG CAPS Take 1 capsule by mouth daily. Take one by mouth twice daily    . CRESTOR 20 MG tablet Take 5 mg by mouth daily.  3  . Cyanocobalamin (B-12 PO) Take 1 capsule by mouth daily.     Marland Kitchen donepezil (ARICEPT) 10 MG tablet Take 1 tablet by mouth daily.    . Garlic 0000000 MG CAPS Take 600 mg by mouth daily.     Marland Kitchen MAGNESIUM PO Take 1 tablet by mouth daily.     . Omega-3 Fatty Acids (OMEGA 3 PO) Take 1 capsule by mouth daily as needed (vitamin supllement).    . psyllium (REGULOID) 0.52 G capsule Take 6 capsules by mouth three times a day    . Xylometazoline HCl (OTRIVIN NA) Place 2 sprays into both nostrils daily as needed (nasal congestion). Nasal spray as needed twice daily     No current facility-administered medications for this visit.    Allergies  Allergen Reactions  . Sulfa Antibiotics Other (See Comments) and Swelling  . Amlodipine Besylate Swelling  Swelling of scrotum Swelling of scrotum  . Aliskiren Other (See Comments)    Pt doesn't remember  . Sulfamethoxazole     REACTION: throat swelled  . Tekturna Hct [Aliskiren-Hydrochlorothiazide]     Lower back pain      Review of Systems negative except from HPI and PMH  Physical Exam BP 118/66 mmHg  Pulse 61  Ht 5\' 7"  (1.702 m)  Wt 143 lb (64.864 kg)  BMI 22.39 kg/m2  SpO2 98% Well developed and well nourished in no acute distress HENT normal E scleral and icterus clear Neck Supple JVP flat; carotids brisk and full Clear to ausculation  Regular rate and rhythm, no murmurs gallops or rub Soft with active bowel sounds No clubbing cyanosis  Edema Alert and oriented, grossly normal motor and sensory function Affect flat Skin Warm and Dry  ECG demonstrates atrial pacing at 60 Intervals 116/09/38  Assessment and  Plan  Sinus node dysfunction  Pacemaker-[employer]  fatigue  Device function is normal. We will plan to maintain him on his  medications. I've encouraged him to follow-up regarding evaluation for depression and sleep apnea

## 2015-07-31 ENCOUNTER — Encounter: Payer: Self-pay | Admitting: *Deleted

## 2015-09-17 DIAGNOSIS — F02818 Dementia in other diseases classified elsewhere, unspecified severity, with other behavioral disturbance: Secondary | ICD-10-CM | POA: Insufficient documentation

## 2015-10-31 ENCOUNTER — Ambulatory Visit (INDEPENDENT_AMBULATORY_CARE_PROVIDER_SITE_OTHER): Payer: Medicare Other | Admitting: *Deleted

## 2015-10-31 DIAGNOSIS — I495 Sick sinus syndrome: Secondary | ICD-10-CM

## 2015-10-31 NOTE — Progress Notes (Signed)
Remote pacemaker transmission.   

## 2015-11-01 ENCOUNTER — Encounter: Payer: Self-pay | Admitting: Cardiology

## 2015-11-08 ENCOUNTER — Telehealth: Payer: Self-pay | Admitting: Internal Medicine

## 2015-11-08 NOTE — Telephone Encounter (Signed)
New message      Calling to see if his pacemaker checks itself automatically

## 2015-11-08 NOTE — Telephone Encounter (Signed)
Notified patients wife that his device is checked automatically.

## 2015-11-14 DIAGNOSIS — R159 Full incontinence of feces: Secondary | ICD-10-CM | POA: Insufficient documentation

## 2015-11-26 LAB — CUP PACEART REMOTE DEVICE CHECK
Brady Statistic RV Percent Paced: 2 %
Implantable Lead Implant Date: 20150506
Implantable Lead Location: 753859
Lead Channel Setting Pacing Amplitude: 2 V
Lead Channel Setting Pacing Amplitude: 2.4 V
Lead Channel Setting Pacing Pulse Width: 0.4 ms
MDC IDC LEAD IMPLANT DT: 20150506
MDC IDC LEAD LOCATION: 753860
MDC IDC LEAD SERIAL: 265257
MDC IDC LEAD SERIAL: 310823
MDC IDC MSMT BATTERY REMAINING LONGEVITY: 90 mo
MDC IDC MSMT BATTERY REMAINING PERCENTAGE: 100 %
MDC IDC MSMT LEADCHNL RA IMPEDANCE VALUE: 440 Ohm
MDC IDC MSMT LEADCHNL RV IMPEDANCE VALUE: 832 Ohm
MDC IDC SESS DTM: 20170921052700
MDC IDC SET LEADCHNL RV SENSING SENSITIVITY: 2.5 mV
MDC IDC STAT BRADY RA PERCENT PACED: 79 %
Pulse Gen Serial Number: 101958

## 2016-01-30 ENCOUNTER — Ambulatory Visit (INDEPENDENT_AMBULATORY_CARE_PROVIDER_SITE_OTHER): Payer: Medicare Other | Admitting: *Deleted

## 2016-01-30 DIAGNOSIS — I495 Sick sinus syndrome: Secondary | ICD-10-CM

## 2016-01-30 NOTE — Progress Notes (Signed)
Remote pacemaker transmission.   

## 2016-02-07 LAB — CUP PACEART REMOTE DEVICE CHECK
Implantable Lead Location: 753859
Implantable Lead Model: 4087
Implantable Lead Serial Number: 310823
Implantable Pulse Generator Implant Date: 20150506
Lead Channel Impedance Value: 431 Ohm
Lead Channel Impedance Value: 842 Ohm
Lead Channel Setting Pacing Amplitude: 2 V
Lead Channel Setting Pacing Amplitude: 2.4 V
Lead Channel Setting Pacing Pulse Width: 0.4 ms
MDC IDC LEAD IMPLANT DT: 20150506
MDC IDC LEAD IMPLANT DT: 20150506
MDC IDC LEAD LOCATION: 753860
MDC IDC LEAD SERIAL: 265257
MDC IDC MSMT BATTERY REMAINING LONGEVITY: 84 mo
MDC IDC MSMT BATTERY REMAINING PERCENTAGE: 100 %
MDC IDC SESS DTM: 20171221070700
MDC IDC SET LEADCHNL RV SENSING SENSITIVITY: 2.5 mV
MDC IDC STAT BRADY RA PERCENT PACED: 77 %
MDC IDC STAT BRADY RV PERCENT PACED: 2 %
Pulse Gen Serial Number: 101958

## 2016-03-03 DIAGNOSIS — F333 Major depressive disorder, recurrent, severe with psychotic symptoms: Secondary | ICD-10-CM | POA: Insufficient documentation

## 2016-04-30 ENCOUNTER — Ambulatory Visit (INDEPENDENT_AMBULATORY_CARE_PROVIDER_SITE_OTHER): Payer: Medicare Other | Admitting: *Deleted

## 2016-04-30 DIAGNOSIS — I495 Sick sinus syndrome: Secondary | ICD-10-CM

## 2016-04-30 NOTE — Progress Notes (Signed)
Remote pacemaker transmission.   

## 2016-05-01 ENCOUNTER — Encounter: Payer: Self-pay | Admitting: Cardiology

## 2016-05-07 LAB — CUP PACEART REMOTE DEVICE CHECK
Battery Remaining Percentage: 100 %
Brady Statistic RV Percent Paced: 2 %
Date Time Interrogation Session: 20180322054000
Implantable Lead Implant Date: 20150506
Implantable Lead Location: 753859
Implantable Lead Location: 753860
Implantable Lead Serial Number: 310823
Lead Channel Impedance Value: 424 Ohm
Lead Channel Setting Pacing Amplitude: 2 V
Lead Channel Setting Pacing Amplitude: 2.4 V
MDC IDC LEAD IMPLANT DT: 20150506
MDC IDC LEAD SERIAL: 265257
MDC IDC MSMT BATTERY REMAINING LONGEVITY: 84 mo
MDC IDC MSMT LEADCHNL RV IMPEDANCE VALUE: 825 Ohm
MDC IDC PG IMPLANT DT: 20150506
MDC IDC PG SERIAL: 101958
MDC IDC SET LEADCHNL RV PACING PULSEWIDTH: 0.4 ms
MDC IDC SET LEADCHNL RV SENSING SENSITIVITY: 2.5 mV
MDC IDC STAT BRADY RA PERCENT PACED: 73 %

## 2016-06-10 ENCOUNTER — Ambulatory Visit: Payer: Medicare Other | Admitting: Allergy and Immunology

## 2016-08-07 ENCOUNTER — Ambulatory Visit (INDEPENDENT_AMBULATORY_CARE_PROVIDER_SITE_OTHER): Payer: Medicare Other | Admitting: Internal Medicine

## 2016-08-07 ENCOUNTER — Encounter: Payer: Self-pay | Admitting: Internal Medicine

## 2016-08-07 VITALS — BP 114/66 | HR 61 | Ht 69.0 in | Wt 138.8 lb

## 2016-08-07 DIAGNOSIS — Z950 Presence of cardiac pacemaker: Secondary | ICD-10-CM | POA: Diagnosis not present

## 2016-08-07 DIAGNOSIS — I495 Sick sinus syndrome: Secondary | ICD-10-CM | POA: Diagnosis not present

## 2016-08-07 NOTE — Patient Instructions (Signed)
Medication Instructions: - Your physician recommends that you continue on your current medications as directed. Please refer to the Current Medication list given to you today.  Labwork: - none ordered  Procedures/Testing: - none ordered  Follow-Up: - Remote monitoring is used to monitor your Pacemaker of ICD from home. This monitoring reduces the number of office visits required to check your device to one time per year. It allows [place] to keep an eye on the functioning of your device to ensure it is working properly. You are scheduled for a device check from home on 11/09/16. You may send your transmission at any time that day. If you have a wireless device, the transmission will be sent automatically. After your physician reviews your transmission, you will receive a postcard with your next transmission date.  - Your physician wants you to follow-up in: 1 year with Tommye Standard, PA for Dr. Caryl Comes. You will receive a reminder letter in the mail two months in advance. If you don't receive a letter, please call our office to schedule the follow-up appointment.   Any Additional Special Instructions Will Be Listed Below (If Applicable).     If you need a refill on your cardiac medications before your next appointment, please call your pharmacy.

## 2016-08-07 NOTE — Progress Notes (Signed)
Patient Care Team: Nicoletta Dress, MD as PCP - General (Internal Medicine)   HPI  Alan [name] is a 83 y.o. male Seen in follow-up for sinus bradycardia and chronotropic incompetence. He been seen here at [facility]. Multiple discussions event had regarding pacing and he has deferred over the years. We last saw him in 2014  at [facility] he received a Chiropractor.    He is undergoing evaluation now for dementia concomitant depression. He is taking Remeron. There may be some improvement.  He denies chest pain or shortness of breath. He has a great deal of lassitude.       Past Medical History:  Diagnosis Date  . Anemia   . Anxiety 07/19/2008   Qualifier: Diagnosis of  By: Hollie Salk CMA, Estill Bamberg    . Arthritis   . Bradycardia    chronotropic incompetence  . Carotid stenosis    <60% 2012  . Diabetes mellitus    Borderline  . Diverticulosis   . Esophageal stricture   . Exercise intolerance   . Hemorrhoids   . Hernia   . Hypertension   . Lightheadedness 10/14/2010  . Peptic ulcer disease   . Prostate cancer ([place])   . Vitamin B 12 deficiency     Past Surgical History:  Procedure Laterality Date  . RETROPUBIC PROSTATECTOMY  2005   Radical  . SKIN SURGERY     basal cell carcinoma    Current Outpatient Prescriptions  Medication Sig Dispense Refill  . ALPRAZolam (XANAX) 0.25 MG tablet Take 1 tablet by mouth at bedtime as needed. May take another tablet by mouth if awakens during the night  5  . aspirin 81 MG tablet Take 81 mg by mouth daily.      . Cholecalciferol (VITAMIN D PO) Take 2,000 Units by mouth daily.    . Cinnamon 500 MG capsule Take 500 mg by mouth daily.    . Coenzyme Q10 (CO Q 10) 100 MG CAPS Take 1 capsule by mouth daily. Take one by mouth twice daily    . CRESTOR 20 MG tablet Take 5 mg by mouth daily.  3  . Cyanocobalamin (B-12 PO) Take 1 capsule by mouth daily.     Marland Kitchen donepezil (ARICEPT) 10 MG tablet Take 1 tablet by mouth  daily.    . Garlic 465 MG CAPS Take 600 mg by mouth daily.     Marland Kitchen MAGNESIUM PO Take 1 tablet by mouth daily.     . mirtazapine (REMERON) 15 MG tablet Take 15 mg by mouth every evening.  2  . Omega-3 Fatty Acids (OMEGA 3 PO) Take 1 capsule by mouth daily as needed (vitamin supllement).    . psyllium (REGULOID) 0.52 G capsule Take 6 capsules by mouth three times a day    . Xylometazoline HCl (OTRIVIN NA) Place 2 sprays into both nostrils daily as needed (nasal congestion). Nasal spray as needed twice daily     No current facility-administered medications for this visit.     Allergies  Allergen Reactions  . Cefdinir Other (See Comments)    confusion  . Effexor [Venlafaxine] Other (See Comments)    "extremely dizzy"  . Sulfa Antibiotics Other (See Comments) and Swelling  . Amlodipine Besylate Swelling    Swelling of scrotum Swelling of scrotum  . Aliskiren Other (See Comments)    Pt doesn't remember  . Sulfamethoxazole     REACTION: throat swelled  . Tekturna Hct [Aliskiren-Hydrochlorothiazide]  Lower back pain      Review of Systems negative except from HPI and PMH  Physical Exam BP 114/66 (BP Location: Left Arm)   Pulse 61   Ht 5\' 9"  (1.753 m)   Wt 138 lb 12.8 oz (63 kg)   BMI 20.50 kg/m  Well developed and nourished in no acute distress HENT normal Neck supple with JVP-flat Carotids brisk and full without bruits Clear Device pocket well healed; without hematoma or erythema.  There is no tethering  Regular rate and rhythm, no murmurs or gallops Abd-soft with active BS without hepatomegaly No Clubbing cyanosis edema Skin-warm and dry A & Oriented  Grossly normal sensory and motor function  ECG demonstrates sinus rhythm at 61 intervals 13/08/37  Assessment and  Plan  Sinus node dysfunction  Pacemaker-[employer]   Depression   Device function is normal.   he doesn't sleep well at night with his nose getting clogged up. I suggested Breathe Right  strips

## 2016-08-18 LAB — CUP PACEART INCLINIC DEVICE CHECK
Implantable Lead Implant Date: 20150506
Implantable Lead Location: 753859
Implantable Lead Location: 753860
Implantable Lead Model: 4086
Implantable Lead Serial Number: 265257
Implantable Pulse Generator Implant Date: 20150506
Lead Channel Impedance Value: 790 Ohm
Lead Channel Pacing Threshold Amplitude: 1.1 V
Lead Channel Pacing Threshold Pulse Width: 0.4 ms
Lead Channel Pacing Threshold Pulse Width: 0.4 ms
Lead Channel Sensing Intrinsic Amplitude: 23.2 mV
Lead Channel Setting Pacing Amplitude: 2 V
Lead Channel Setting Sensing Sensitivity: 2.5 mV
MDC IDC LEAD IMPLANT DT: 20150506
MDC IDC LEAD SERIAL: 310823
MDC IDC MSMT BATTERY REMAINING LONGEVITY: 84 mo
MDC IDC MSMT LEADCHNL RA IMPEDANCE VALUE: 453 Ohm
MDC IDC MSMT LEADCHNL RA PACING THRESHOLD AMPLITUDE: 0.7 V
MDC IDC MSMT LEADCHNL RA SENSING INTR AMPL: 4.8 mV
MDC IDC SESS DTM: 20180710130109
MDC IDC SET LEADCHNL RV PACING AMPLITUDE: 2.4 V
MDC IDC SET LEADCHNL RV PACING PULSEWIDTH: 0.4 ms
MDC IDC STAT BRADY RA PERCENT PACED: 68 %
MDC IDC STAT BRADY RV PERCENT PACED: 2 %
Pulse Gen Serial Number: 101958

## 2016-08-27 DIAGNOSIS — F0390 Unspecified dementia without behavioral disturbance: Secondary | ICD-10-CM

## 2016-08-27 DIAGNOSIS — I472 Ventricular tachycardia: Secondary | ICD-10-CM | POA: Diagnosis not present

## 2016-08-27 DIAGNOSIS — E875 Hyperkalemia: Secondary | ICD-10-CM

## 2016-08-28 ENCOUNTER — Telehealth: Payer: Self-pay | Admitting: *Deleted

## 2016-08-28 DIAGNOSIS — I472 Ventricular tachycardia: Secondary | ICD-10-CM

## 2016-08-28 NOTE — Telephone Encounter (Signed)
Late entry from 08/28/16 at 0827:  Dr. Baxter Hire from [facility] called regarding patient.  He presented to ED yesterday, now inpatient, presenting rhythm appeared VT but PPM interrogation from yesterday raises question of possible SVT.  Dr. Baxter Hire would like an EP to review strips if possible.  Advised I will get in touch with Joey, [place], and with Dr. Curt Bears at the hospital.  Dr. Baxter Hire requests call back at her direct phone number.

## 2016-08-28 NOTE — Telephone Encounter (Signed)
Dr. Curt Bears reviewed strips and discussed with Dr. Baxter Hire via phone.

## 2016-08-29 DIAGNOSIS — F0390 Unspecified dementia without behavioral disturbance: Secondary | ICD-10-CM

## 2016-08-29 DIAGNOSIS — I471 Supraventricular tachycardia: Secondary | ICD-10-CM

## 2016-08-29 DIAGNOSIS — I472 Ventricular tachycardia: Secondary | ICD-10-CM | POA: Diagnosis not present

## 2016-08-30 DIAGNOSIS — I471 Supraventricular tachycardia: Secondary | ICD-10-CM | POA: Diagnosis not present

## 2016-08-30 DIAGNOSIS — I472 Ventricular tachycardia: Secondary | ICD-10-CM | POA: Diagnosis not present

## 2016-08-30 DIAGNOSIS — F0390 Unspecified dementia without behavioral disturbance: Secondary | ICD-10-CM | POA: Diagnosis not present

## 2016-09-01 ENCOUNTER — Encounter: Payer: Self-pay | Admitting: Internal Medicine

## 2016-09-01 ENCOUNTER — Ambulatory Visit (INDEPENDENT_AMBULATORY_CARE_PROVIDER_SITE_OTHER): Payer: Medicare Other | Admitting: Internal Medicine

## 2016-09-01 VITALS — BP 102/56 | HR 70 | Ht 67.0 in | Wt 136.0 lb

## 2016-09-01 DIAGNOSIS — I495 Sick sinus syndrome: Secondary | ICD-10-CM | POA: Diagnosis not present

## 2016-09-01 DIAGNOSIS — I471 Supraventricular tachycardia: Secondary | ICD-10-CM

## 2016-09-01 DIAGNOSIS — Z950 Presence of cardiac pacemaker: Secondary | ICD-10-CM | POA: Diagnosis not present

## 2016-09-01 LAB — CUP PACEART INCLINIC DEVICE CHECK
Implantable Lead Implant Date: 20150506
Implantable Lead Location: 753860
Implantable Lead Model: 4087
Implantable Lead Serial Number: 265257
Implantable Pulse Generator Implant Date: 20150506
Lead Channel Impedance Value: 441 Ohm
Lead Channel Pacing Threshold Amplitude: 0.7 V
Lead Channel Pacing Threshold Pulse Width: 0.4 ms
Lead Channel Setting Pacing Amplitude: 2.4 V
MDC IDC LEAD IMPLANT DT: 20150506
MDC IDC LEAD LOCATION: 753859
MDC IDC LEAD SERIAL: 310823
MDC IDC MSMT BATTERY REMAINING LONGEVITY: 65 mo
MDC IDC MSMT LEADCHNL RA SENSING INTR AMPL: 5.3 mV
MDC IDC MSMT LEADCHNL RV IMPEDANCE VALUE: 858 Ohm
MDC IDC MSMT LEADCHNL RV PACING THRESHOLD AMPLITUDE: 1.1 V
MDC IDC MSMT LEADCHNL RV PACING THRESHOLD PULSEWIDTH: 0.4 ms
MDC IDC MSMT LEADCHNL RV SENSING INTR AMPL: 23.8 mV
MDC IDC SESS DTM: 20180724170038
MDC IDC SET LEADCHNL RA PACING AMPLITUDE: 2 V
MDC IDC SET LEADCHNL RV PACING PULSEWIDTH: 0.4 ms
MDC IDC SET LEADCHNL RV SENSING SENSITIVITY: 2.5 mV
Pulse Gen Serial Number: 101958

## 2016-09-01 NOTE — Progress Notes (Signed)
Patient Care Team: Nicoletta Dress, MD as PCP - General (Internal Medicine)   HPI  Alan [name] is a 83 y.o. male Seen in follow-up for sinus bradycardia and chronotropic incompetence. He been seen here at [facility]. Multiple discussions event had regarding pacing and he has deferred over the years. We last saw him in 2014  at [facility] he received a Chiropractor.    He is undergoing evaluation now for dementia concomitant depression. He is taking Remeron. There may be some improvement.  He had the abrupt onset of tachycardia. There is no significant associated lightheadedness or shortness of breath. A magnet was applied at the [facility] ER with the termination of tachycardia  100 fluids were reviewed and were normal.     Past Medical History:  Diagnosis Date  . Anemia   . Anxiety 07/19/2008   Qualifier: Diagnosis of  By: Hollie Salk CMA, Estill Bamberg    . Arthritis   . Bradycardia    chronotropic incompetence  . Carotid stenosis    <60% 2012  . Diabetes mellitus    Borderline  . Diverticulosis   . Esophageal stricture   . Exercise intolerance   . Hemorrhoids   . Hernia   . Hypertension   . Lightheadedness 10/14/2010  . Peptic ulcer disease   . Prostate cancer ([place])   . Vitamin B 12 deficiency     Past Surgical History:  Procedure Laterality Date  . RETROPUBIC PROSTATECTOMY  2005   Radical  . SKIN SURGERY     basal cell carcinoma    Current Outpatient Prescriptions  Medication Sig Dispense Refill  . ALPRAZolam (XANAX) 0.25 MG tablet Take 1 tablet by mouth at bedtime as needed. May take another tablet by mouth if awakens during the night  5  . aspirin 81 MG tablet Take 81 mg by mouth daily.      . Cholecalciferol (VITAMIN D PO) Take 2,000 Units by mouth daily.    . Cinnamon 500 MG capsule Take 500 mg by mouth daily.    . Coenzyme Q10 (CO Q 10) 100 MG CAPS Take 1 capsule by mouth daily. Take one by mouth twice daily    . CRESTOR 20 MG tablet  Take 5 mg by mouth daily.  3  . Cyanocobalamin (B-12 PO) Take 1 capsule by mouth daily.     Marland Kitchen donepezil (ARICEPT) 10 MG tablet Take 1 tablet by mouth daily.    . Garlic 144 MG CAPS Take 600 mg by mouth daily.     Marland Kitchen MAGNESIUM PO Take 1 tablet by mouth daily.     . metoprolol tartrate (LOPRESSOR) 25 MG tablet Take 1 tablet by mouth 2 (two) times daily.    . mirtazapine (REMERON) 15 MG tablet Take 15 mg by mouth every evening.  2  . Omega-3 Fatty Acids (OMEGA 3 PO) Take 1 capsule by mouth daily as needed (vitamin supllement).    . Xylometazoline HCl (OTRIVIN NA) Place 2 sprays into both nostrils daily as needed (nasal congestion). Nasal spray as needed twice daily     No current facility-administered medications for this visit.     Allergies  Allergen Reactions  . Cefdinir Other (See Comments)    confusion  . Effexor [Venlafaxine] Other (See Comments)    "extremely dizzy"  . Sulfa Antibiotics Other (See Comments) and Swelling  . Amlodipine Besylate Swelling    Swelling of scrotum Swelling of scrotum  . Aliskiren Other (See Comments)  Pt doesn't remember  . Sulfamethoxazole     REACTION: throat swelled  . Tekturna Hct [Aliskiren-Hydrochlorothiazide]     Lower back pain      Review of Systems negative except from HPI and PMH  Physical Exam BP (!) 102/56   Pulse 70   Ht 5\' 7"  (1.702 m)   Wt 136 lb (61.7 kg)   SpO2 96%   BMI 21.30 kg/m  Well developed and nourished in no acute distress HENT normal Neck supple with JVP-flat Carotids brisk and full without bruits Clear Regular rate and rhythm, no murmurs or gallops Abd-soft with active BS without hepatomegaly No Clubbing cyanosis edema Skin-warm and dry A & Oriented  Grossly normal sensory and motor function   Note  interrogation strips from [facility] were reviewed    Assessment and  Plan  Sinus node dysfunction  Pacemaker-[employer]   Depression   Supraventricular tachycardia probably AV nodal  reentry   The patient developed a tachycardia in the context of PACs that had to 1 one-to-one conduction. It was stopped with a synchronous pacing. There appears to be a long AV interval with initiation of tachycardia and no significant warmup.  It was stopped at the [facility] ER by the application of a magnet resulting in a synchronous pacing. A great idea.  We will plan to stop his Lopressor as the frequency of these events is not at all clear. We have given him a magnet to apply with recurrent tachycardia. We reviewed Valsalva maneuvers.  We will see him in one year. He is to call if he has recurrent events.

## 2016-09-01 NOTE — Patient Instructions (Addendum)
Medication Instructions: - Your physician has recommended you make the following change in your medication:  1) Stop lopressor (metoprolol)  Labwork: - none ordered  Procedures/Testing: - none ordered  Follow-Up: - Remote monitoring is used to monitor your Pacemaker of ICD from home. This monitoring reduces the number of office visits required to check your device to one time per year. It allows [place] to keep an eye on the functioning of your device to ensure it is working properly. You are scheduled for a device check from home on 12/01/16. You may send your transmission at any time that day. If you have a wireless device, the transmission will be sent automatically. After your physician reviews your transmission, you will receive a postcard with your next transmission date.  - Your physician wants you to follow-up in: 1 year with Tommye Standard, PA for Dr. Caryl Comes.  You will receive a reminder letter in the mail two months in advance. If you don't receive a letter, please call our office to schedule the follow-up appointment.     Any Additional Special Instructions Will Be Listed Below (If Applicable).     If you need a refill on your cardiac medications before your next appointment, please call your pharmacy.

## 2016-12-01 ENCOUNTER — Ambulatory Visit (INDEPENDENT_AMBULATORY_CARE_PROVIDER_SITE_OTHER): Payer: Medicare Other | Admitting: *Deleted

## 2016-12-01 DIAGNOSIS — I495 Sick sinus syndrome: Secondary | ICD-10-CM | POA: Diagnosis not present

## 2016-12-02 NOTE — Progress Notes (Signed)
Remote pacemaker transmission.   

## 2016-12-03 LAB — CUP PACEART REMOTE DEVICE CHECK
Battery Remaining Percentage: 100 %
Brady Statistic RV Percent Paced: 1 %
Date Time Interrogation Session: 20181023054000
Implantable Lead Implant Date: 20150506
Implantable Lead Location: 753860
Implantable Lead Model: 4086
Lead Channel Setting Pacing Amplitude: 2 V
Lead Channel Setting Sensing Sensitivity: 2.5 mV
MDC IDC LEAD IMPLANT DT: 20150506
MDC IDC LEAD LOCATION: 753859
MDC IDC LEAD SERIAL: 265257
MDC IDC LEAD SERIAL: 310823
MDC IDC MSMT BATTERY REMAINING LONGEVITY: 72 mo
MDC IDC MSMT LEADCHNL RA IMPEDANCE VALUE: 432 Ohm
MDC IDC MSMT LEADCHNL RV IMPEDANCE VALUE: 852 Ohm
MDC IDC PG IMPLANT DT: 20150506
MDC IDC PG SERIAL: 101958
MDC IDC SET LEADCHNL RV PACING AMPLITUDE: 2.4 V
MDC IDC SET LEADCHNL RV PACING PULSEWIDTH: 0.4 ms
MDC IDC STAT BRADY RA PERCENT PACED: 89 %

## 2016-12-04 ENCOUNTER — Encounter: Payer: Self-pay | Admitting: Cardiology

## 2017-03-02 ENCOUNTER — Ambulatory Visit (INDEPENDENT_AMBULATORY_CARE_PROVIDER_SITE_OTHER): Payer: Medicare Other | Admitting: *Deleted

## 2017-03-02 DIAGNOSIS — I495 Sick sinus syndrome: Secondary | ICD-10-CM | POA: Diagnosis not present

## 2017-03-02 NOTE — Progress Notes (Signed)
Remote pacemaker transmission.   

## 2017-03-03 ENCOUNTER — Encounter: Payer: Self-pay | Admitting: Cardiology

## 2017-03-05 ENCOUNTER — Encounter (HOSPITAL_COMMUNITY): Payer: Self-pay

## 2017-03-05 ENCOUNTER — Emergency Department (HOSPITAL_COMMUNITY)
Admission: EM | Admit: 2017-03-05 | Discharge: 2017-03-06 | Disposition: A | Discharge status: Home / Self Care | Payer: Medicare Other | Attending: Emergency Medicine | Admitting: Emergency Medicine

## 2017-03-05 ENCOUNTER — Other Ambulatory Visit: Payer: Self-pay

## 2017-03-05 ENCOUNTER — Emergency Department (HOSPITAL_COMMUNITY): Payer: Medicare Other

## 2017-03-05 DIAGNOSIS — R05 Cough: Secondary | ICD-10-CM | POA: Diagnosis present

## 2017-03-05 DIAGNOSIS — E119 Type 2 diabetes mellitus without complications: Secondary | ICD-10-CM | POA: Insufficient documentation

## 2017-03-05 DIAGNOSIS — I10 Essential (primary) hypertension: Secondary | ICD-10-CM | POA: Diagnosis not present

## 2017-03-05 DIAGNOSIS — Z7982 Long term (current) use of aspirin: Secondary | ICD-10-CM | POA: Insufficient documentation

## 2017-03-05 DIAGNOSIS — J399 Disease of upper respiratory tract, unspecified: Secondary | ICD-10-CM | POA: Insufficient documentation

## 2017-03-05 DIAGNOSIS — Z79899 Other long term (current) drug therapy: Secondary | ICD-10-CM | POA: Diagnosis not present

## 2017-03-05 LAB — URINALYSIS, ROUTINE W REFLEX MICROSCOPIC
Bilirubin Urine: NEGATIVE
Glucose, UA: NEGATIVE mg/dL
Hgb urine dipstick: NEGATIVE
Ketones, ur: 20 mg/dL — AB
Leukocytes, UA: NEGATIVE
Nitrite: NEGATIVE
Protein, ur: NEGATIVE mg/dL
Specific Gravity, Urine: 1.026 (ref 1.005–1.030)
pH: 5 (ref 5.0–8.0)

## 2017-03-05 LAB — COMPREHENSIVE METABOLIC PANEL
ALT: 22 U/L (ref 17–63)
AST: 37 U/L (ref 15–41)
Albumin: 4.2 g/dL (ref 3.5–5.0)
Alkaline Phosphatase: 99 U/L (ref 38–126)
Anion gap: 13 (ref 5–15)
BUN: 26 mg/dL — ABNORMAL HIGH (ref 6–20)
CO2: 22 mmol/L (ref 22–32)
Calcium: 8.8 mg/dL — ABNORMAL LOW (ref 8.9–10.3)
Chloride: 101 mmol/L (ref 101–111)
Creatinine, Ser: 1.03 mg/dL (ref 0.61–1.24)
GFR calc Af Amer: >60 mL/min (ref >60)
GFR calc non Af Amer: >60 mL/min (ref >60)
Glucose, Bld: 108 mg/dL — ABNORMAL HIGH (ref 65–99)
Potassium: 4.4 mmol/L (ref 3.5–5.1)
Sodium: 136 mmol/L (ref 135–145)
Total Bilirubin: 0.6 mg/dL (ref 0.3–1.2)
Total Protein: 7 g/dL (ref 6.5–8.1)

## 2017-03-05 LAB — CBC
HCT: 41.2 % (ref 39.0–52.0)
Hemoglobin: 13.8 g/dL (ref 13.0–17.0)
MCH: 31.6 pg (ref 26.0–34.0)
MCHC: 33.5 g/dL (ref 30.0–36.0)
MCV: 94.3 fL (ref 78.0–100.0)
Platelets: 140 10*3/uL — ABNORMAL LOW (ref 150–400)
RBC: 4.37 MIL/uL (ref 4.22–5.81)
RDW: 13.4 % (ref 11.5–15.5)
WBC: 5.5 10*3/uL (ref 4.0–10.5)

## 2017-03-05 LAB — LIPASE, BLOOD: Lipase: 40 U/L (ref 11–51)

## 2017-03-05 MED ORDER — AZITHROMYCIN 250 MG PO TABS: 250.0000 mg | ORAL_TABLET | Freq: Every day | ORAL | 0 refills | Status: DC

## 2017-03-05 MED ORDER — ACETAMINOPHEN 325 MG PO TABS: 650.0000 mg | ORAL_TABLET | Freq: Once | ORAL | Status: DC | PRN

## 2017-03-05 NOTE — ED Triage Notes (Signed)
Pt reports he was started on amoxicillin, kenalog inj, and tylenol yesterday for sinus infection and bronchitis. Last night pt began having loose stools with bright red blood mixed in. Pt denies abdominal pain. Endorses feeling weaker than normal

## 2017-03-05 NOTE — ED Notes (Signed)
Pt remains in waiting room. Updated on wait for treatment room. 

## 2017-03-07 NOTE — ED Provider Notes (Signed)
[facility] EMERGENCY DEPARTMENT Provider Note   CSN: [id number] Arrival date & time: 03/05/17  1528     History   Chief Complaint Chief Complaint  Patient presents with  . Melena    HPI Alan [name] is a 84 y.o. male.  HPI   84 year old male presenting for evaluation of congestion and cough.  He was discharged on amoxicillin yesterday.  Was also given a Kenalog injection.  P reports she was diagnosed with a sinus infection and bronchitis.  He has been taking Tylenol for fever but is concerned wheezing keeps coming back.  He feels generally weak.  Denies any acute pain.  No urinary complaints.  He did have one bloody bowel movement last night but subsequent bowel movements have not grown any blood since then.   He is not anticoagulated.  Past Medical History:  Diagnosis Date  . Anemia   . Anxiety 07/19/2008   Qualifier: Diagnosis of  By: Hollie Salk CMA, Estill Bamberg    . Arthritis   . Bradycardia    chronotropic incompetence  . Carotid stenosis    <60% 2012  . Diabetes mellitus    Borderline  . Diverticulosis   . Esophageal stricture   . Exercise intolerance   . Hemorrhoids   . Hernia   . Hypertension   . Lightheadedness 10/14/2010  . Peptic ulcer disease   . Prostate cancer ([place])   . Vitamin B 12 deficiency     Patient Active Problem List   Diagnosis Date Noted  . Memory loss 06/13/2013  . Chest pain on exertion 07/06/2012  . Lightheadedness 10/14/2010  . Carotid stenosis   . OSA (obstructive sleep apnea) 05/05/2010  . Insomnia, psychophysiological 05/05/2010  . ROSACEA 04/04/2010  . HYPERGLYCEMIA 04/04/2010  . TUBULOVILLOUS ADENOMA, COLON, HX OF 03/26/2010  . ESOPHAGEAL STRICTURE 03/25/2010  . HEARTBURN 03/25/2010  . OTHER DYSPHAGIA 03/25/2010  . HYPERLIPIDEMIA 07/25/2009  . HYPERTENSION, BENIGN 01/08/2009  . SINUS BRADYCARDIA 07/19/2008  . Anxiety 07/19/2008  . PERSONAL HISTORY MALIGNANT NEOPLASM PROSTATE 07/19/2008    Past Surgical  History:  Procedure Laterality Date  . RETROPUBIC PROSTATECTOMY  2005   Radical  . SKIN SURGERY     basal cell carcinoma       Home Medications    Prior to Admission medications   Medication Sig Start Date End Date Taking? Authorizing Provider  ALPRAZolam Duanne Moron) 0.25 MG tablet Take 1 tablet by mouth at bedtime as needed. May take another tablet by mouth if awakens during the night 10/24/14  Yes [provider]  amoxicillin (AMOXIL) 500 MG tablet Take 500 mg by mouth 3 (three) times daily. x10 days.   Yes [provider]  aspirin 81 MG tablet Take 81 mg by mouth daily.     Yes [provider]  b complex vitamins tablet Take 1 tablet by mouth daily.   Yes [provider]  Cholecalciferol (VITAMIN D PO) Take 2,000 Units by mouth daily.   Yes [provider]  Cinnamon 500 MG capsule Take 500 mg by mouth daily.   Yes [provider]  Coenzyme Q10 (CO Q 10) 100 MG CAPS Take 1 capsule by mouth daily.    Yes [provider]  CRESTOR 20 MG tablet Take 10 mg by mouth daily.  09/20/14  Yes [provider]  Cyanocobalamin (B-12 PO) Take 1 capsule by mouth daily.    Yes [provider]  donepezil (ARICEPT) 10 MG tablet Take 10 mg by mouth  daily.   Yes [provider]  folic acid (FOLVITE) 935 MCG tablet Take 400 mcg by mouth daily.   Yes [provider]  Garlic 701 MG CAPS Take 600 mg by mouth daily.    Yes [provider]  MAGNESIUM PO Take 1 tablet by mouth daily.    Yes [provider]  mirtazapine (REMERON) 15 MG tablet Take 15 mg by mouth every evening. 06/19/16  Yes [provider]  Omega-3 Fatty Acids (OMEGA 3 PO) Take 1 capsule by mouth daily as needed (vitamin supllement).   Yes [provider]  omeprazole (PRILOSEC) 40 MG capsule Take 40 mg by mouth daily as needed for indigestion. 02/28/14  Yes [provider]  vitamin C (ASCORBIC ACID) 500 MG  tablet Take 500 mg by mouth daily.   Yes [provider]  Xylometazoline HCl (OTRIVIN NA) Place 2 sprays into both nostrils daily as needed (nasal congestion). Nasal spray as needed twice daily   Yes [provider]  azithromycin (ZITHROMAX Z-PAK) 250 MG tablet Take 1 tablet (250 mg total) by mouth daily. 2 pills (500mg ) day 1 then 1 pill (250mg ) days 2-5. 03/05/17   Virgel Manifold, MD    Family History Family History  Problem Relation Age of Onset  . Heart disease Mother   . Stroke Mother   . Cancer Father        prostate  . Stroke Father   . Cancer Paternal Grandfather        prostate    Social History Social History   Tobacco Use  . Smoking status: Never Smoker  . Smokeless tobacco: Never Used  Substance Use Topics  . Alcohol use: No  . Drug use: No     Allergies   Cefdinir; Effexor [venlafaxine]; Sulfa antibiotics; Amlodipine besylate; Aliskiren; Sulfamethoxazole; and Tekturna hct [aliskiren-hydrochlorothiazide]   Review of Systems Review of Systems  All systems reviewed and negative, other than as noted in HPI.   Physical Exam Updated Vital Signs BP 133/72   Pulse 70   Temp (!) 101.8 F (38.8 C) (Oral)   Resp 16   SpO2 94%   Physical Exam  Constitutional: He appears well-developed and well-nourished. No distress.  Sitting in bed.  Appears tired, but nontoxic.  HENT:  Head: Normocephalic and atraumatic.  Eyes: Conjunctivae are normal. Right eye exhibits no discharge. Left eye exhibits no discharge.  Neck: Neck supple.  Cardiovascular: Normal rate, regular rhythm and normal heart sounds. Exam reveals no gallop and no friction rub.  No murmur heard. Pulmonary/Chest: Effort normal and breath sounds normal. No respiratory distress.  Abdominal: Soft. He exhibits no distension. There is no tenderness.  Musculoskeletal: He exhibits no edema or tenderness.  Neurological: He is alert.  Skin: Skin is warm and dry.  Psychiatric: He has a normal  mood and affect. His behavior is normal. Thought content normal.  Nursing note and vitals reviewed.    ED Treatments / Results  Labs (all labs ordered are listed, but only abnormal results are displayed) Labs Reviewed  COMPREHENSIVE METABOLIC PANEL - Abnormal; Notable for the following components:      Result Value   Glucose, Bld 108 (*)    BUN 26 (*)    Calcium 8.8 (*)    All other components within normal limits  CBC - Abnormal; Notable for the following components:   Platelets 140 (*)    All other components within normal limits  URINALYSIS, ROUTINE W REFLEX MICROSCOPIC - Abnormal; Notable  for the following components:   Ketones, ur 20 (*)    All other components within normal limits  LIPASE, BLOOD    EKG  EKG Interpretation None       Radiology Dg Chest 2 View  Result Date: 03/06/2017 CLINICAL DATA:  Fever and cough EXAM: CHEST  2 VIEW COMPARISON:  09/01/2016 FINDINGS: Cardiac pacemaker. Normal heart size and pulmonary vascularity. No focal airspace disease or consolidation in the lungs. No blunting of costophrenic angles. No pneumothorax. Mediastinal contours appear intact. Degenerative changes in the spine. Calcification of the aorta. IMPRESSION: No evidence of active pulmonary disease.  Aortic atherosclerosis. Electronically Signed   By: Lucienne Capers M.D.   On: 03/06/2017 00:14    Procedures Procedures (including critical care time)  Medications Ordered in ED Medications - No data to display   Initial Impression / Assessment and Plan / ED Course  I have reviewed the triage vital signs and the nursing notes.  Pertinent labs & imaging results that were available during my care of the patient were reviewed by me and considered in my medical decision making (see chart for details).    Final Clinical Impressions(s) / ED Diagnoses   Final diagnoses:  Upper respiratory disease    ED Discharge Orders        Ordered    azithromycin (ZITHROMAX Z-PAK) 250 MG  tablet  Daily     03/05/17 2335       Virgel Manifold, MD 03/07/17 2002

## 2017-03-23 LAB — CUP PACEART REMOTE DEVICE CHECK
Battery Remaining Percentage: 100 %
Brady Statistic RA Percent Paced: 90 %
Implantable Lead Implant Date: 20150506
Implantable Lead Location: 753860
Implantable Lead Model: 4087
Implantable Lead Serial Number: 310823
Implantable Pulse Generator Implant Date: 20150506
Lead Channel Impedance Value: 434 Ohm
Lead Channel Impedance Value: 814 Ohm
Lead Channel Setting Pacing Amplitude: 2 V
Lead Channel Setting Pacing Pulse Width: 0.4 ms
MDC IDC LEAD IMPLANT DT: 20150506
MDC IDC LEAD LOCATION: 753859
MDC IDC LEAD SERIAL: 265257
MDC IDC MSMT BATTERY REMAINING LONGEVITY: 66 mo
MDC IDC SESS DTM: 20190122061400
MDC IDC SET LEADCHNL RV PACING AMPLITUDE: 2.4 V
MDC IDC SET LEADCHNL RV SENSING SENSITIVITY: 2.5 mV
MDC IDC STAT BRADY RV PERCENT PACED: 1 %
Pulse Gen Serial Number: 101958

## 2017-06-01 ENCOUNTER — Ambulatory Visit (INDEPENDENT_AMBULATORY_CARE_PROVIDER_SITE_OTHER): Payer: Medicare Other | Admitting: *Deleted

## 2017-06-01 DIAGNOSIS — I495 Sick sinus syndrome: Secondary | ICD-10-CM

## 2017-06-01 NOTE — Progress Notes (Signed)
Remote pacemaker transmission.   

## 2017-06-02 ENCOUNTER — Encounter: Payer: Self-pay | Admitting: Cardiology

## 2017-06-03 LAB — CUP PACEART REMOTE DEVICE CHECK
Battery Remaining Percentage: 100 %
Brady Statistic RV Percent Paced: 1 %
Implantable Lead Implant Date: 20150506
Implantable Lead Location: 753859
Implantable Lead Location: 753860
Implantable Lead Model: 4087
Lead Channel Impedance Value: 415 Ohm
Lead Channel Setting Pacing Amplitude: 2 V
Lead Channel Setting Pacing Pulse Width: 0.4 ms
MDC IDC LEAD IMPLANT DT: 20150506
MDC IDC LEAD SERIAL: 265257
MDC IDC LEAD SERIAL: 310823
MDC IDC MSMT BATTERY REMAINING LONGEVITY: 66 mo
MDC IDC MSMT LEADCHNL RV IMPEDANCE VALUE: 819 Ohm
MDC IDC PG IMPLANT DT: 20150506
MDC IDC SESS DTM: 20190423055400
MDC IDC SET LEADCHNL RV PACING AMPLITUDE: 2.4 V
MDC IDC SET LEADCHNL RV SENSING SENSITIVITY: 2.5 mV
MDC IDC STAT BRADY RA PERCENT PACED: 90 %
Pulse Gen Serial Number: 101958

## 2017-08-31 ENCOUNTER — Ambulatory Visit (INDEPENDENT_AMBULATORY_CARE_PROVIDER_SITE_OTHER): Payer: Medicare Other | Admitting: *Deleted

## 2017-08-31 ENCOUNTER — Telehealth: Payer: Self-pay | Admitting: Cardiology

## 2017-08-31 DIAGNOSIS — I495 Sick sinus syndrome: Secondary | ICD-10-CM | POA: Diagnosis not present

## 2017-08-31 NOTE — Telephone Encounter (Signed)
LMOVM reminding pt to send remote transmission.   

## 2017-08-31 NOTE — Progress Notes (Signed)
Remote pacemaker transmission.   

## 2017-10-09 LAB — CUP PACEART REMOTE DEVICE CHECK
Battery Remaining Percentage: 95 %
Brady Statistic RV Percent Paced: 1 %
Date Time Interrogation Session: 20190723051500
Implantable Lead Location: 753859
Implantable Lead Location: 753860
Implantable Lead Model: 4086
Implantable Lead Model: 4087
Implantable Lead Serial Number: 265257
Implantable Lead Serial Number: 310823
Lead Channel Impedance Value: 872 Ohm
Lead Channel Setting Pacing Amplitude: 2 V
Lead Channel Setting Pacing Pulse Width: 0.4 ms
Lead Channel Setting Sensing Sensitivity: 2.5 mV
MDC IDC LEAD IMPLANT DT: 20150506
MDC IDC LEAD IMPLANT DT: 20150506
MDC IDC MSMT BATTERY REMAINING LONGEVITY: 60 mo
MDC IDC MSMT LEADCHNL RA IMPEDANCE VALUE: 428 Ohm
MDC IDC PG IMPLANT DT: 20150506
MDC IDC PG SERIAL: 101958
MDC IDC SET LEADCHNL RV PACING AMPLITUDE: 2.4 V
MDC IDC STAT BRADY RA PERCENT PACED: 91 %

## 2017-11-23 ENCOUNTER — Encounter: Payer: Self-pay | Admitting: Physician Assistant

## 2017-11-30 ENCOUNTER — Ambulatory Visit (INDEPENDENT_AMBULATORY_CARE_PROVIDER_SITE_OTHER): Payer: Medicare Other | Admitting: *Deleted

## 2017-11-30 DIAGNOSIS — I495 Sick sinus syndrome: Secondary | ICD-10-CM

## 2017-11-30 NOTE — Progress Notes (Signed)
Remote pacemaker transmission.   

## 2017-12-01 ENCOUNTER — Encounter: Payer: Self-pay | Admitting: Cardiology

## 2017-12-01 NOTE — Progress Notes (Signed)
Letter  

## 2017-12-12 NOTE — Progress Notes (Signed)
Cardiology Office Note Date:  12/13/2017  Patient ID:  Alan [name], Alan [name] 09-Jun-1931, MRN [id number] PCP:  Nicoletta Dress, MD  Electrophysiologist:  Dr. Caryl Comes    Chief Complaint: annual EP visit  History of Present Illness: Alan [name] is a 84 y.o. male with history of prostate  (treated surgically), HTN, anxiety/depression, dementia, symptomatic bradycardia w/PPM.  He comes in today to be seen for Dr. Caryl Comes, last seen by him in July 2018.  At that time, discussed a [facility] ER visit with an SVT that was terminated by asynchronous pacing by applying a magnet.  His lopressor was stopped it seem with infrequent burden of tachycardia and given a magnet to use for recurrent palpitations.  He is accompanied by his wife.  Reports poor sleep, waking about every hour to urinate.  This has been discussed with his urologist by the patient does not want to have further surgery.  No CP, palpitations or SOB, no dizziness, near syncope or syncope.  He has not felt any tachycardia.  Device information: BSci dual chamber PPM, implanted 06/14/13 (Dr. Rosita Fire), follwed by Dr. Caryl Comes  Past Medical History:  Diagnosis Date  . Anemia   . Anxiety 07/19/2008   Qualifier: Diagnosis of  By: Hollie Salk CMA, Estill Bamberg    . Arthritis   . Bradycardia    chronotropic incompetence  . Carotid stenosis    <60% 2012  . Diabetes mellitus    Borderline  . Diverticulosis   . Esophageal stricture   . Exercise intolerance   . Hemorrhoids   . Hernia   . Hypertension   . Lightheadedness 10/14/2010  . Peptic ulcer disease   . Prostate cancer ([place])   . Vitamin B 12 deficiency     Past Surgical History:  Procedure Laterality Date  . RETROPUBIC PROSTATECTOMY  2005   Radical  . SKIN SURGERY     basal cell carcinoma    Current Outpatient Medications  Medication Sig Dispense Refill  . ALPRAZolam (XANAX) 0.25 MG tablet Take 1 tablet by mouth at bedtime as needed. May take another tablet by mouth if awakens during the  night  5  . aspirin 81 MG tablet Take 81 mg by mouth daily.      Marland Kitchen b complex vitamins tablet Take 1 tablet by mouth daily.    . Cholecalciferol (VITAMIN D PO) Take 2,000 Units by mouth daily.    . Cinnamon 500 MG capsule Take 500 mg by mouth daily.    . Coenzyme Q10 (CO Q 10) 100 MG CAPS Take 1 capsule by mouth daily.     . CRESTOR 20 MG tablet Take 10 mg by mouth daily.   3  . Cyanocobalamin (B-12 PO) Take 1 capsule by mouth daily.     Marland Kitchen donepezil (ARICEPT) 10 MG tablet Take 10 mg by mouth daily.    . Garlic 756 MG CAPS Take 600 mg by mouth daily.     Marland Kitchen MAGNESIUM PO Take 1 tablet by mouth daily.     . mirtazapine (REMERON) 15 MG tablet Take 15 mg by mouth every evening.  2  . Omega-3 Fatty Acids (OMEGA 3 PO) Take 1 capsule by mouth daily as needed (vitamin supllement).    . vitamin C (ASCORBIC ACID) 500 MG tablet Take 500 mg by mouth daily.    . Xylometazoline HCl (OTRIVIN NA) Place 2 sprays into both nostrils daily as needed (nasal congestion). Nasal spray as needed twice daily     No current  facility-administered medications for this visit.     Allergies:   Cefdinir; Effexor [venlafaxine]; Sulfa antibiotics; Amlodipine besylate; Aliskiren; Sulfamethoxazole; and Tekturna hct [aliskiren-hydrochlorothiazide]   Social History:  The patient  reports that he has never smoked. He has never used smokeless tobacco. He reports that he does not drink alcohol or use drugs.   Family History:  The patient's family history includes Cancer in his father and paternal grandfather; Heart disease in his mother; Stroke in his father and mother.  ROS:  Please see the history of present illness. All other systems are reviewed and otherwise negative.   PHYSICAL EXAM:  VS:  BP 114/62   Pulse 70   Ht 5\' 7"  (1.702 m)   Wt 139 lb (63 kg)   BMI 21.77 kg/m  BMI: Body mass index is 21.77 kg/m. Well nourished, well developed, in no acute distress  HEENT: normocephalic, atraumatic  Neck: no JVD, carotid  bruits or masses Cardiac:  RRR; no significant murmurs, no rubs, or gallops Lungs:  CTA b/l, no wheezing, rhonchi or rales  Abd: soft, nontender MS: no deformity, age appropriate atrophy Ext: no edema  Skin: warm and dry, no rash Neuro:  No gross deficits appreciated Psych: euthymic mood, full affect  PPM site is stable, no tethering or discomfort   EKG:  Done today and reviewed by myself AP/VS PPM interrogation done today and reviewed by myself: battery and lead measurents are good, NSVT episodes, EGMs available for review are brief 1:1 tachycardias  07/14/12: Stress myoview Impression Exercise Capacity:  Lexiscan with low level exercise. BP Response:  Normal blood pressure response. Clinical Symptoms:  The exercise was limited by fatigue. Walked 9:20 but unable to get heart rate above 103 so he was switched to [employer].. ECG Impression:  No significant ST segment change suggestive of ischemia. Comparison with Prior Nuclear Study: No images to compare Overall Impression:  Low risk stress nuclear study . No ischemia. Patient demonstrates chronotropic incompetence..  LV Ejection Fraction: 62%.  LV Wall Motion:  NL LV Function; NL Wall Motion   08/30/07: TTE SUMMARY - Overall left ventricular systolic function was normal. There were    no left ventricular regional wall motion abnormalities.    Doppler parameters were consistent with abnormal left    ventricular relaxation. - Left atrial size was at the upper limits of normal.  Recent Labs: 03/05/2017: ALT 22; BUN 26; Creatinine, Ser 1.03; Hemoglobin 13.8; Platelets 140; Potassium 4.4; Sodium 136  No results found for requested labs within last 8760 hours.   CrCl cannot be calculated (Patient's most recent lab result is older than the maximum 21 days allowed.).   Wt Readings from Last 3 Encounters:  12/13/17 139 lb (63 kg)  09/01/16 136 lb (61.7 kg)  08/07/16 138 lb 12.8 oz (63 kg)     Other studies  reviewed: Additional studies/records reviewed today include: summarized above  ASSESSMENT AND PLAN:  1. PPM     Intact function     SR 50's today underlying  2. SVT     Brief 1:1 AT noted only  3. HTN     Looks OK, no changes   Disposition: F/u with his Q 3 month remotes, in-clinic in 1 year, sooner if needed  Current medicines are reviewed at length with the patient today.  The patient did not have any concerns regarding medicines.  Venetia Night, PA-C 12/13/2017 12:15 PM     [place]  [phone] (office)  [phone] (fax)

## 2017-12-13 ENCOUNTER — Ambulatory Visit (INDEPENDENT_AMBULATORY_CARE_PROVIDER_SITE_OTHER): Payer: Medicare Other | Admitting: Physician Assistant

## 2017-12-13 VITALS — BP 114/62 | HR 70 | Ht 67.0 in | Wt 139.0 lb

## 2017-12-13 DIAGNOSIS — I471 Supraventricular tachycardia: Secondary | ICD-10-CM

## 2017-12-13 DIAGNOSIS — Z950 Presence of cardiac pacemaker: Secondary | ICD-10-CM | POA: Diagnosis not present

## 2017-12-13 DIAGNOSIS — I495 Sick sinus syndrome: Secondary | ICD-10-CM | POA: Diagnosis not present

## 2017-12-13 DIAGNOSIS — I10 Essential (primary) hypertension: Secondary | ICD-10-CM

## 2017-12-13 NOTE — Patient Instructions (Addendum)
Medication Instructions:   Your physician recommends that you continue on your current medications as directed. Please refer to the Current Medication list given to you today.   If you need a refill on your cardiac medications before your next appointment, please call your pharmacy.  Labwork: NONE ORDERED  TODAY    Testing/Procedures: NONE ORDERED  TODAY    Follow-Up: Your physician wants you to follow-up in: [place] will receive a reminder letter in the mail two months in advance. If you don't receive a letter, please call our office to schedule the follow-up appointment.  Remote monitoring is used to monitor your Pacemaker of ICD from home. This monitoring reduces the number of office visits required to check your device to one time per year. It allows [place] to keep an eye on the functioning of your device to ensure it is working properly. You are scheduled for a device check from home on .  01-29-18 You may send your transmission at any time that day. If you have a wireless device, the transmission will be sent automatically. After your physician reviews your transmission, you will receive a postcard with your next transmission date.     Any Other Special Instructions Will Be Listed Below (If Applicable).

## 2018-01-01 LAB — CUP PACEART REMOTE DEVICE CHECK
Implantable Lead Implant Date: 20150506
Implantable Lead Model: 4086
Implantable Lead Model: 4087
Implantable Lead Serial Number: 310823
Lead Channel Setting Pacing Amplitude: 2.4 V
MDC IDC LEAD IMPLANT DT: 20150506
MDC IDC LEAD LOCATION: 753859
MDC IDC LEAD LOCATION: 753860
MDC IDC LEAD SERIAL: 265257
MDC IDC PG IMPLANT DT: 20150506
MDC IDC SESS DTM: 20191123202807
MDC IDC SET LEADCHNL RA PACING AMPLITUDE: 2 V
MDC IDC SET LEADCHNL RV PACING PULSEWIDTH: 0.4 ms
MDC IDC SET LEADCHNL RV SENSING SENSITIVITY: 2.5 mV
Pulse Gen Serial Number: 101958

## 2018-02-10 ENCOUNTER — Telehealth: Payer: Self-pay

## 2018-02-10 NOTE — Telephone Encounter (Signed)
Spoke with pt's wife today regarding an episode of SVT found on his remote alert from 12/30 @ 1403. Pt's wife states pt was out splitting wood at that time. He has not complained of palpitations or dizziness. I advised pt's wife that he may call us back if he wanted to discuss this more.  Pt's wife verbalized understanding and had no additional questions.

## 2018-03-01 ENCOUNTER — Ambulatory Visit (INDEPENDENT_AMBULATORY_CARE_PROVIDER_SITE_OTHER): Payer: Medicare Other

## 2018-03-01 DIAGNOSIS — I495 Sick sinus syndrome: Secondary | ICD-10-CM | POA: Diagnosis not present

## 2018-03-02 NOTE — Progress Notes (Signed)
Remote pacemaker transmission.   

## 2018-03-03 LAB — CUP PACEART REMOTE DEVICE CHECK
Battery Remaining Longevity: 54 mo
Battery Remaining Percentage: 85 %
Implantable Lead Implant Date: 20150506
Implantable Lead Implant Date: 20150506
Implantable Lead Location: 753860
Implantable Lead Model: 4087
Implantable Lead Serial Number: 310823
Implantable Pulse Generator Implant Date: 20150506
Lead Channel Impedance Value: 417 Ohm
Lead Channel Impedance Value: 862 Ohm
Lead Channel Setting Pacing Pulse Width: 0.4 ms
MDC IDC LEAD LOCATION: 753859
MDC IDC LEAD SERIAL: 265257
MDC IDC PG SERIAL: 101958
MDC IDC SESS DTM: 20200121061500
MDC IDC SET LEADCHNL RA PACING AMPLITUDE: 2 V
MDC IDC SET LEADCHNL RV PACING AMPLITUDE: 2.4 V
MDC IDC SET LEADCHNL RV SENSING SENSITIVITY: 2.5 mV
MDC IDC STAT BRADY RA PERCENT PACED: 93 %
MDC IDC STAT BRADY RV PERCENT PACED: 0 %

## 2018-03-04 ENCOUNTER — Encounter: Payer: Self-pay | Admitting: Cardiology

## 2018-05-31 ENCOUNTER — Other Ambulatory Visit: Payer: Self-pay

## 2018-05-31 ENCOUNTER — Ambulatory Visit (INDEPENDENT_AMBULATORY_CARE_PROVIDER_SITE_OTHER): Payer: Medicare Other | Admitting: *Deleted

## 2018-05-31 DIAGNOSIS — I495 Sick sinus syndrome: Secondary | ICD-10-CM

## 2018-05-31 DIAGNOSIS — I471 Supraventricular tachycardia: Secondary | ICD-10-CM

## 2018-05-31 LAB — CUP PACEART REMOTE DEVICE CHECK
Battery Remaining Longevity: 48 mo
Battery Remaining Percentage: 78 %
Brady Statistic RA Percent Paced: 94 %
Brady Statistic RV Percent Paced: 0 %
Date Time Interrogation Session: 20200421052800
Implantable Lead Implant Date: 20150506
Implantable Lead Implant Date: 20150506
Implantable Lead Location: 753859
Implantable Lead Location: 753860
Implantable Lead Model: 4086
Implantable Lead Model: 4087
Implantable Lead Serial Number: 265257
Implantable Lead Serial Number: 310823
Implantable Pulse Generator Implant Date: 20150506
Lead Channel Impedance Value: 428 Ohm
Lead Channel Impedance Value: 874 Ohm
Lead Channel Setting Pacing Amplitude: 2 V
Lead Channel Setting Pacing Amplitude: 2.4 V
Lead Channel Setting Pacing Pulse Width: 0.4 ms
Lead Channel Setting Sensing Sensitivity: 2.5 mV
Pulse Gen Serial Number: 101958

## 2018-06-06 NOTE — Progress Notes (Signed)
Remote pacemaker transmission.   

## 2018-08-04 ENCOUNTER — Telehealth: Payer: Self-pay | Admitting: Internal Medicine

## 2018-08-04 NOTE — Telephone Encounter (Signed)
Received alert .Pt reports no cardiac sx during 12 sec episode of ?AVNRT on 08/03/18 @ 1344.

## 2018-08-05 NOTE — Telephone Encounter (Signed)
Pt was asymptomatic during event.

## 2018-08-05 NOTE — Telephone Encounter (Signed)
Alan [name]  thanks   note how it stops, the last A is just a touch early; but the V is not-- this suggests that the V is coming from the [place]

## 2018-08-30 ENCOUNTER — Ambulatory Visit (INDEPENDENT_AMBULATORY_CARE_PROVIDER_SITE_OTHER): Payer: Medicare Other | Admitting: *Deleted

## 2018-08-30 DIAGNOSIS — I495 Sick sinus syndrome: Secondary | ICD-10-CM | POA: Diagnosis not present

## 2018-08-30 LAB — CUP PACEART REMOTE DEVICE CHECK
Battery Remaining Longevity: 48 mo
Battery Remaining Percentage: 74 %
Brady Statistic RA Percent Paced: 94 %
Brady Statistic RV Percent Paced: 0 %
Date Time Interrogation Session: 20200721051500
Implantable Lead Implant Date: 20150506
Implantable Lead Implant Date: 20150506
Implantable Lead Location: 753859
Implantable Lead Location: 753860
Implantable Lead Model: 4086
Implantable Lead Model: 4087
Implantable Lead Serial Number: 265257
Implantable Lead Serial Number: 310823
Implantable Pulse Generator Implant Date: 20150506
Lead Channel Impedance Value: 420 Ohm
Lead Channel Impedance Value: 899 Ohm
Lead Channel Setting Pacing Amplitude: 2 V
Lead Channel Setting Pacing Amplitude: 2.4 V
Lead Channel Setting Pacing Pulse Width: 0.4 ms
Lead Channel Setting Sensing Sensitivity: 2.5 mV
Pulse Gen Serial Number: 101958

## 2018-09-02 NOTE — Telephone Encounter (Signed)
Opened in error

## 2018-09-14 ENCOUNTER — Encounter: Payer: Self-pay | Admitting: Cardiology

## 2018-09-14 NOTE — Progress Notes (Signed)
Remote pacemaker transmission.   

## 2018-11-29 ENCOUNTER — Ambulatory Visit (INDEPENDENT_AMBULATORY_CARE_PROVIDER_SITE_OTHER): Payer: Medicare Other | Admitting: *Deleted

## 2018-11-29 DIAGNOSIS — I495 Sick sinus syndrome: Secondary | ICD-10-CM | POA: Diagnosis not present

## 2018-11-29 LAB — CUP PACEART REMOTE DEVICE CHECK
Battery Remaining Longevity: 42 mo
Battery Remaining Percentage: 71 %
Brady Statistic RA Percent Paced: 94 %
Brady Statistic RV Percent Paced: 0 %
Date Time Interrogation Session: 20201020052800
Implantable Lead Implant Date: 20150506
Implantable Lead Implant Date: 20150506
Implantable Lead Location: 753859
Implantable Lead Location: 753860
Implantable Lead Model: 4086
Implantable Lead Model: 4087
Implantable Lead Serial Number: 265257
Implantable Lead Serial Number: 310823
Implantable Pulse Generator Implant Date: 20150506
Lead Channel Impedance Value: 439 Ohm
Lead Channel Impedance Value: 881 Ohm
Lead Channel Setting Pacing Amplitude: 2 V
Lead Channel Setting Pacing Amplitude: 2.4 V
Lead Channel Setting Pacing Pulse Width: 0.4 ms
Lead Channel Setting Sensing Sensitivity: 2.5 mV
Pulse Gen Serial Number: 101958

## 2018-12-14 NOTE — Progress Notes (Signed)
Remote pacemaker transmission.   

## 2019-02-28 ENCOUNTER — Ambulatory Visit (INDEPENDENT_AMBULATORY_CARE_PROVIDER_SITE_OTHER): Payer: Medicare Other | Admitting: *Deleted

## 2019-02-28 DIAGNOSIS — I495 Sick sinus syndrome: Secondary | ICD-10-CM

## 2019-02-28 LAB — CUP PACEART REMOTE DEVICE CHECK
Battery Remaining Longevity: 42 mo
Battery Remaining Percentage: 63 %
Brady Statistic RA Percent Paced: 93 %
Brady Statistic RV Percent Paced: 0 %
Date Time Interrogation Session: 20210119015300
Implantable Lead Implant Date: 20150506
Implantable Lead Implant Date: 20150506
Implantable Lead Location: 753859
Implantable Lead Location: 753860
Implantable Lead Model: 4086
Implantable Lead Model: 4087
Implantable Lead Serial Number: 265257
Implantable Lead Serial Number: 310823
Implantable Pulse Generator Implant Date: 20150506
Lead Channel Impedance Value: 434 Ohm
Lead Channel Impedance Value: 846 Ohm
Lead Channel Setting Pacing Amplitude: 2 V
Lead Channel Setting Pacing Amplitude: 2.4 V
Lead Channel Setting Pacing Pulse Width: 0.4 ms
Lead Channel Setting Sensing Sensitivity: 2.5 mV
Pulse Gen Serial Number: 101958

## 2019-05-09 ENCOUNTER — Other Ambulatory Visit: Payer: Self-pay

## 2019-05-09 ENCOUNTER — Ambulatory Visit (INDEPENDENT_AMBULATORY_CARE_PROVIDER_SITE_OTHER): Payer: Medicare Other | Admitting: Podiatry

## 2019-05-09 DIAGNOSIS — B351 Tinea unguium: Secondary | ICD-10-CM

## 2019-05-09 DIAGNOSIS — M79609 Pain in unspecified limb: Secondary | ICD-10-CM

## 2019-05-30 ENCOUNTER — Ambulatory Visit (INDEPENDENT_AMBULATORY_CARE_PROVIDER_SITE_OTHER): Payer: Medicare Other | Admitting: *Deleted

## 2019-05-30 DIAGNOSIS — I495 Sick sinus syndrome: Secondary | ICD-10-CM

## 2019-05-30 LAB — CUP PACEART REMOTE DEVICE CHECK
Battery Remaining Longevity: 36 mo
Battery Remaining Percentage: 56 %
Brady Statistic RA Percent Paced: 93 %
Brady Statistic RV Percent Paced: 0 %
Date Time Interrogation Session: 20210420020700
Implantable Lead Implant Date: 20150506
Implantable Lead Implant Date: 20150506
Implantable Lead Location: 753859
Implantable Lead Location: 753860
Implantable Lead Model: 4086
Implantable Lead Model: 4087
Implantable Lead Serial Number: 265257
Implantable Lead Serial Number: 310823
Implantable Pulse Generator Implant Date: 20150506
Lead Channel Impedance Value: 443 Ohm
Lead Channel Impedance Value: 819 Ohm
Lead Channel Setting Pacing Amplitude: 2 V
Lead Channel Setting Pacing Amplitude: 2.4 V
Lead Channel Setting Pacing Pulse Width: 0.4 ms
Lead Channel Setting Sensing Sensitivity: 2.5 mV
Pulse Gen Serial Number: 101958

## 2019-05-31 NOTE — Progress Notes (Signed)
PPM Remote  

## 2019-08-13 NOTE — Progress Notes (Signed)
  Subjective:  Patient ID: Alan [name], male    DOB: [birth year],  MRN: [id number]  Chief Complaint  Patient presents with  . Foot Problem    pt c/o hard skin at Rt hallux (top of toe) and nail care -BL hallux digging -nails are hard -nails curving in Tx: none     86 y.o. male presents with the above complaint. History confirmed with patient.   Objective:  Physical Exam: warm, good capillary refill, nail exam thickened nails with pain to palpation onychocryptosis, no trophic changes or ulcerative lesions. DP pulses palpable, PT pulses palpable and protective sensation intact Left Foot: normal exam, no swelling, tenderness, instability; ligaments intact, full range of motion of all ankle/foot joints  Right Foot: normal exam, no swelling, tenderness, instability; ligaments intact, full range of motion of all ankle/foot joints   No images are attached to the encounter.  Assessment:   1. Pain due to onychomycosis of nail      Plan:  Patient was evaluated and treated and all questions answered.  Onychomycosis -Nails palliatively debrided secondary to pain -Educated on self-care.  Advised he does not need criteria for routine nail care.  Procedure: Nail Debridement Rationale: Pain Type of Debridement: manual, sharp debridement. Instrumentation: Nail nipper, rotary burr. Number of Nails: 10    Return if symptoms worsen or fail to improve.

## 2019-08-22 ENCOUNTER — Ambulatory Visit (INDEPENDENT_AMBULATORY_CARE_PROVIDER_SITE_OTHER): Payer: Medicare Other | Admitting: Student

## 2019-08-22 ENCOUNTER — Encounter: Payer: Self-pay | Admitting: Student

## 2019-08-22 ENCOUNTER — Other Ambulatory Visit: Payer: Self-pay

## 2019-08-22 VITALS — BP 102/58 | HR 70 | Ht 67.0 in | Wt 132.0 lb

## 2019-08-22 DIAGNOSIS — I471 Supraventricular tachycardia: Secondary | ICD-10-CM

## 2019-08-22 DIAGNOSIS — Z950 Presence of cardiac pacemaker: Secondary | ICD-10-CM | POA: Diagnosis not present

## 2019-08-22 DIAGNOSIS — I495 Sick sinus syndrome: Secondary | ICD-10-CM | POA: Diagnosis not present

## 2019-08-22 DIAGNOSIS — I10 Essential (primary) hypertension: Secondary | ICD-10-CM | POA: Diagnosis not present

## 2019-08-22 LAB — CUP PACEART INCLINIC DEVICE CHECK
Date Time Interrogation Session: 20210713143306
Implantable Lead Implant Date: 20150506
Implantable Lead Implant Date: 20150506
Implantable Lead Location: 753859
Implantable Lead Location: 753860
Implantable Lead Model: 4086
Implantable Lead Model: 4087
Implantable Lead Serial Number: 265257
Implantable Lead Serial Number: 310823
Implantable Pulse Generator Implant Date: 20150506
Lead Channel Impedance Value: 429 Ohm
Lead Channel Impedance Value: 830 Ohm
Lead Channel Pacing Threshold Amplitude: 0.7 V
Lead Channel Pacing Threshold Amplitude: 0.9 V
Lead Channel Pacing Threshold Pulse Width: 0.4 ms
Lead Channel Pacing Threshold Pulse Width: 0.4 ms
Lead Channel Sensing Intrinsic Amplitude: 12.6 mV
Lead Channel Sensing Intrinsic Amplitude: 3.9 mV
Lead Channel Setting Pacing Amplitude: 2 V
Lead Channel Setting Pacing Amplitude: 2.4 V
Lead Channel Setting Pacing Pulse Width: 0.4 ms
Lead Channel Setting Sensing Sensitivity: 2.5 mV
Pulse Gen Serial Number: 101958

## 2019-08-22 NOTE — Progress Notes (Signed)
Electrophysiology Office Note Date: 08/22/2019  ID:  Alan [name], DOB 05/01/1931, MRN [id number]  PCP: Nicoletta Dress, MD Primary Cardiologist: No primary care provider on file. Electrophysiologist: Alan Axe, MD   CC: Pacemaker follow-up  Alan [name] is a 86 y.o. male seen today for Alan Axe, MD for routine electrophysiology followup.  Since last being seen in our clinic the patient reports doing very well. He has no specific complaints today. he denies chest pain, palpitations, dyspnea, PND, orthopnea, nausea, vomiting, dizziness, syncope, edema, weight gain, or early satiety.  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 Rosita [name]) for symptomatic bradycardia  Past Medical History:  Diagnosis Date  . Anemia   . Anxiety 07/19/2008   Qualifier: Diagnosis of  By: Hollie Salk CMA, Estill Bamberg    . Arthritis   . Bradycardia    chronotropic incompetence  . Carotid stenosis    <60% 2012  . Diabetes mellitus    Borderline  . Diverticulosis   . Esophageal stricture   . Exercise intolerance   . Hemorrhoids   . Hernia   . Hypertension   . Lightheadedness 10/14/2010  . Peptic ulcer disease   . Prostate cancer ([place])   . Vitamin B 12 deficiency    Past Surgical History:  Procedure Laterality Date  . RETROPUBIC PROSTATECTOMY  2005   Radical  . SKIN SURGERY     basal cell carcinoma    Current Outpatient Medications  Medication Sig Dispense Refill  . ALPRAZolam (XANAX) 0.25 MG tablet Take 1 tablet by mouth at bedtime as needed. May take another tablet by mouth if awakens during the night  5  . aspirin 81 MG tablet Take 81 mg by mouth daily.      Marland Kitchen b complex vitamins tablet Take 1 tablet by mouth daily.    . Cholecalciferol (VITAMIN D PO) Take 2,000 Units by mouth daily.    . Cinnamon 500 MG capsule Take 500 mg by mouth daily.    . Coenzyme Q10 (CO Q 10) 100 MG CAPS Take 1 capsule by mouth daily.     . CRESTOR 20 MG tablet Take 20 mg by mouth  daily.   3  . Cyanocobalamin (B-12 PO) Take 1 capsule by mouth daily.     Marland Kitchen donepezil (ARICEPT) 10 MG tablet Take 10 mg by mouth daily.    . Garlic 295 MG CAPS Take 600 mg by mouth daily.     Marland Kitchen MAGNESIUM PO Take 1 tablet by mouth daily.     . mirtazapine (REMERON) 15 MG tablet Take 15 mg by mouth every evening.  2  . Omega-3 Fatty Acids (OMEGA 3 PO) Take 1 capsule by mouth daily as needed (vitamin supllement).    . vitamin C (ASCORBIC ACID) 500 MG tablet Take 500 mg by mouth daily.    . Xylometazoline HCl (OTRIVIN NA) Place 2 sprays into both nostrils daily as needed (nasal congestion). Nasal spray as needed twice daily     No current facility-administered medications for this visit.    Allergies:   Cefdinir, Effexor [venlafaxine], Sulfa antibiotics, Amlodipine besylate, Aliskiren, Sulfamethoxazole, and Tekturna hct [aliskiren-hydrochlorothiazide]   Social History: Social History   Socioeconomic History  . Marital status: Married    Spouse name: Not on file  . Number of children: 1  . Years of education: Not on file  . Highest education level: Not on file  Occupational History  . Occupation: Retired    Comment: Chief Financial Officer  Tobacco Use  . Smoking status: Never Smoker  . Smokeless tobacco: Never Used  Vaping Use  . Vaping Use: Never used  Substance and Sexual Activity  . Alcohol use: No  . Drug use: No  . Sexual activity: Not on file  Other Topics Concern  . Not on file  Social History Narrative   Married, 1 son   Right handed   BS    No caffeine   Social Determinants of Health   Financial Resource Strain:   . Difficulty of Paying Living Expenses:   Food Insecurity:   . Worried About Charity fundraiser in the Last Year:   . Arboriculturist in the Last Year:   Transportation Needs:   . Film/video editor (Medical):   Marland Kitchen Lack of Transportation (Non-Medical):   Physical Activity:   . Days of Exercise per Week:   . Minutes of Exercise per Session:   Stress:   .  Feeling of Stress :   Social Connections:   . Frequency of Communication with Friends and Family:   . Frequency of Social Gatherings with Friends and Family:   . Attends Religious Services:   . Active Member of Clubs or Organizations:   . Attends Archivist Meetings:   Marland Kitchen Marital Status:   Intimate Partner Violence:   . Fear of Current or Ex-Partner:   . Emotionally Abused:   Marland Kitchen Physically Abused:   . Sexually Abused:     Family History: Family History  Problem Relation Age of Onset  . Heart disease Mother   . Stroke Mother   . Cancer Father        prostate  . Stroke Father   . Cancer Paternal Grandfather        prostate     Review of Systems: All other systems reviewed and are otherwise negative except as noted above.  Physical Exam: Vitals:   08/22/19 1134  BP: (!) 102/58  Pulse: 70  SpO2: 96%  Weight: 132 lb (59.9 kg)  Height: 5\' 7"  (1.702 m)     GEN- The patient is well appearing, alert and oriented x 3 today.   HEENT: normocephalic, atraumatic; sclera clear, conjunctiva pink; hearing intact; oropharynx clear; neck supple  Lungs- Clear to ausculation bilaterally, normal work of breathing.  No wheezes, rales, rhonchi Heart- Regular rate and rhythm, no murmurs, rubs or gallops  GI- soft, non-tender, non-distended, bowel sounds present  Extremities- no clubbing, cyanosis, or edema  MS- no significant deformity or atrophy Skin- warm and dry, no rash or lesion; PPM pocket well healed Psych- euthymic mood, full affect Neuro- strength and sensation are intact  PPM Interrogation- reviewed in detail today,  See PACEART report  EKG:  EKG is ordered today. The ekg ordered today shows NSR (AP/VS) at 70 bpm, personally reviewed.  Recent Labs: No results found for requested labs within last 8760 hours.   Wt Readings from Last 3 Encounters:  08/22/19 132 lb (59.9 kg)  12/13/17 139 lb (63 kg)  09/01/16 136 lb (61.7 kg)     Other studies  Reviewed: Additional studies/ records that were reviewed today include: Previous EP office notes, Previous remote checks, Most recent labwork.   Assessment and Plan:  1. Symptomatic bradycardia s/p [employer] PPM  Normal PPM function See Claudia Desanctis Art report No changes today  2. SVT Stable. Overall low burden and asymptomatic.   3. HTN Stable on current medications.  Current medicines are reviewed at length  with the patient today.   The patient does not have concerns regarding his medicines.  The following changes were made today:  none  Labs/ tests ordered today include:  Orders Placed This Encounter  Procedures  . CUP PACEART [place]  . EKG 12-Lead    Disposition:   Follow up with Dr. Caryl Comes in 12 Months   Signed, Annamaria Helling  08/22/2019 2:45 PM  [place] [address] [phone] (office) [phone] (fax)

## 2019-08-22 NOTE — Patient Instructions (Signed)
Medication Instructions:  *If you need a refill on your cardiac medications before your next appointment, please call your pharmacy*  Lab Work: If you have labs (blood work) drawn today and your tests are completely normal, you will receive your results only by: Marland Kitchen MyChart Message (if you have MyChart) OR . A paper copy in the mail If you have any lab test that is abnormal or we need to change your treatment, we will call you to review the results.  Follow-Up: At [facility], you and your health needs are our priority.  As part of our continuing mission to provide you with exceptional heart care, we have created designated Provider Care Teams.  These Care Teams include your primary Cardiologist (physician) and Advanced Practice Providers (APPs -  Physician Assistants and Nurse Practitioners) who all work together to provide you with the care you need, when you need it.  We recommend signing up for the patient portal called "MyChart".  Sign up information is provided on this After Visit Summary.  MyChart is used to connect with patients for Virtual Visits (Telemedicine).  Patients are able to view lab/test results, encounter notes, upcoming appointments, etc.  Non-urgent messages can be sent to your provider as well.   To learn more about what you can do with MyChart, go to [email or web address].    Your next appointment:   Your physician wants you to follow-up in: 1 YEAR with Dr. Caryl Comes. You will receive a reminder letter in the mail two months in advance. If you don't receive a letter, please call our office to schedule the follow-up appointment.  Remote monitoring is used to monitor your Pacemaker of ICD from home. This monitoring reduces the number of office visits required to check your device to one time per year. It allows [place] to keep an eye on the functioning of your device to ensure it is working properly. You are scheduled for a device check from home on 08/29/19. You may send your  transmission at any time that day. If you have a wireless device, the transmission will be sent automatically. After your physician reviews your transmission, you will receive a postcard with your next transmission date.  The format for your next appointment:   In Person with [name], MD

## 2019-08-29 ENCOUNTER — Ambulatory Visit (INDEPENDENT_AMBULATORY_CARE_PROVIDER_SITE_OTHER): Payer: Medicare Other | Admitting: *Deleted

## 2019-08-29 DIAGNOSIS — I495 Sick sinus syndrome: Secondary | ICD-10-CM | POA: Diagnosis not present

## 2019-08-29 LAB — CUP PACEART REMOTE DEVICE CHECK
Battery Remaining Longevity: 30 mo
Battery Remaining Percentage: 48 %
Brady Statistic RA Percent Paced: 83 %
Brady Statistic RV Percent Paced: 0 %
Date Time Interrogation Session: 20210720021900
Implantable Lead Implant Date: 20150506
Implantable Lead Implant Date: 20150506
Implantable Lead Location: 753859
Implantable Lead Location: 753860
Implantable Lead Model: 4086
Implantable Lead Model: 4087
Implantable Lead Serial Number: 265257
Implantable Lead Serial Number: 310823
Implantable Pulse Generator Implant Date: 20150506
Lead Channel Impedance Value: 436 Ohm
Lead Channel Impedance Value: 823 Ohm
Lead Channel Setting Pacing Amplitude: 2 V
Lead Channel Setting Pacing Amplitude: 2.4 V
Lead Channel Setting Pacing Pulse Width: 0.4 ms
Lead Channel Setting Sensing Sensitivity: 2.5 mV
Pulse Gen Serial Number: 101958

## 2019-08-31 NOTE — Progress Notes (Signed)
Remote pacemaker transmission.   

## 2019-09-29 IMAGING — DX DG CHEST 2V
2 series · 2 of 2 positions shown · non-contrast
Comparison: 09/01/2016

CLINICAL DATA: Fever and cough

EXAM:
CHEST  2 VIEW

[chest pa]
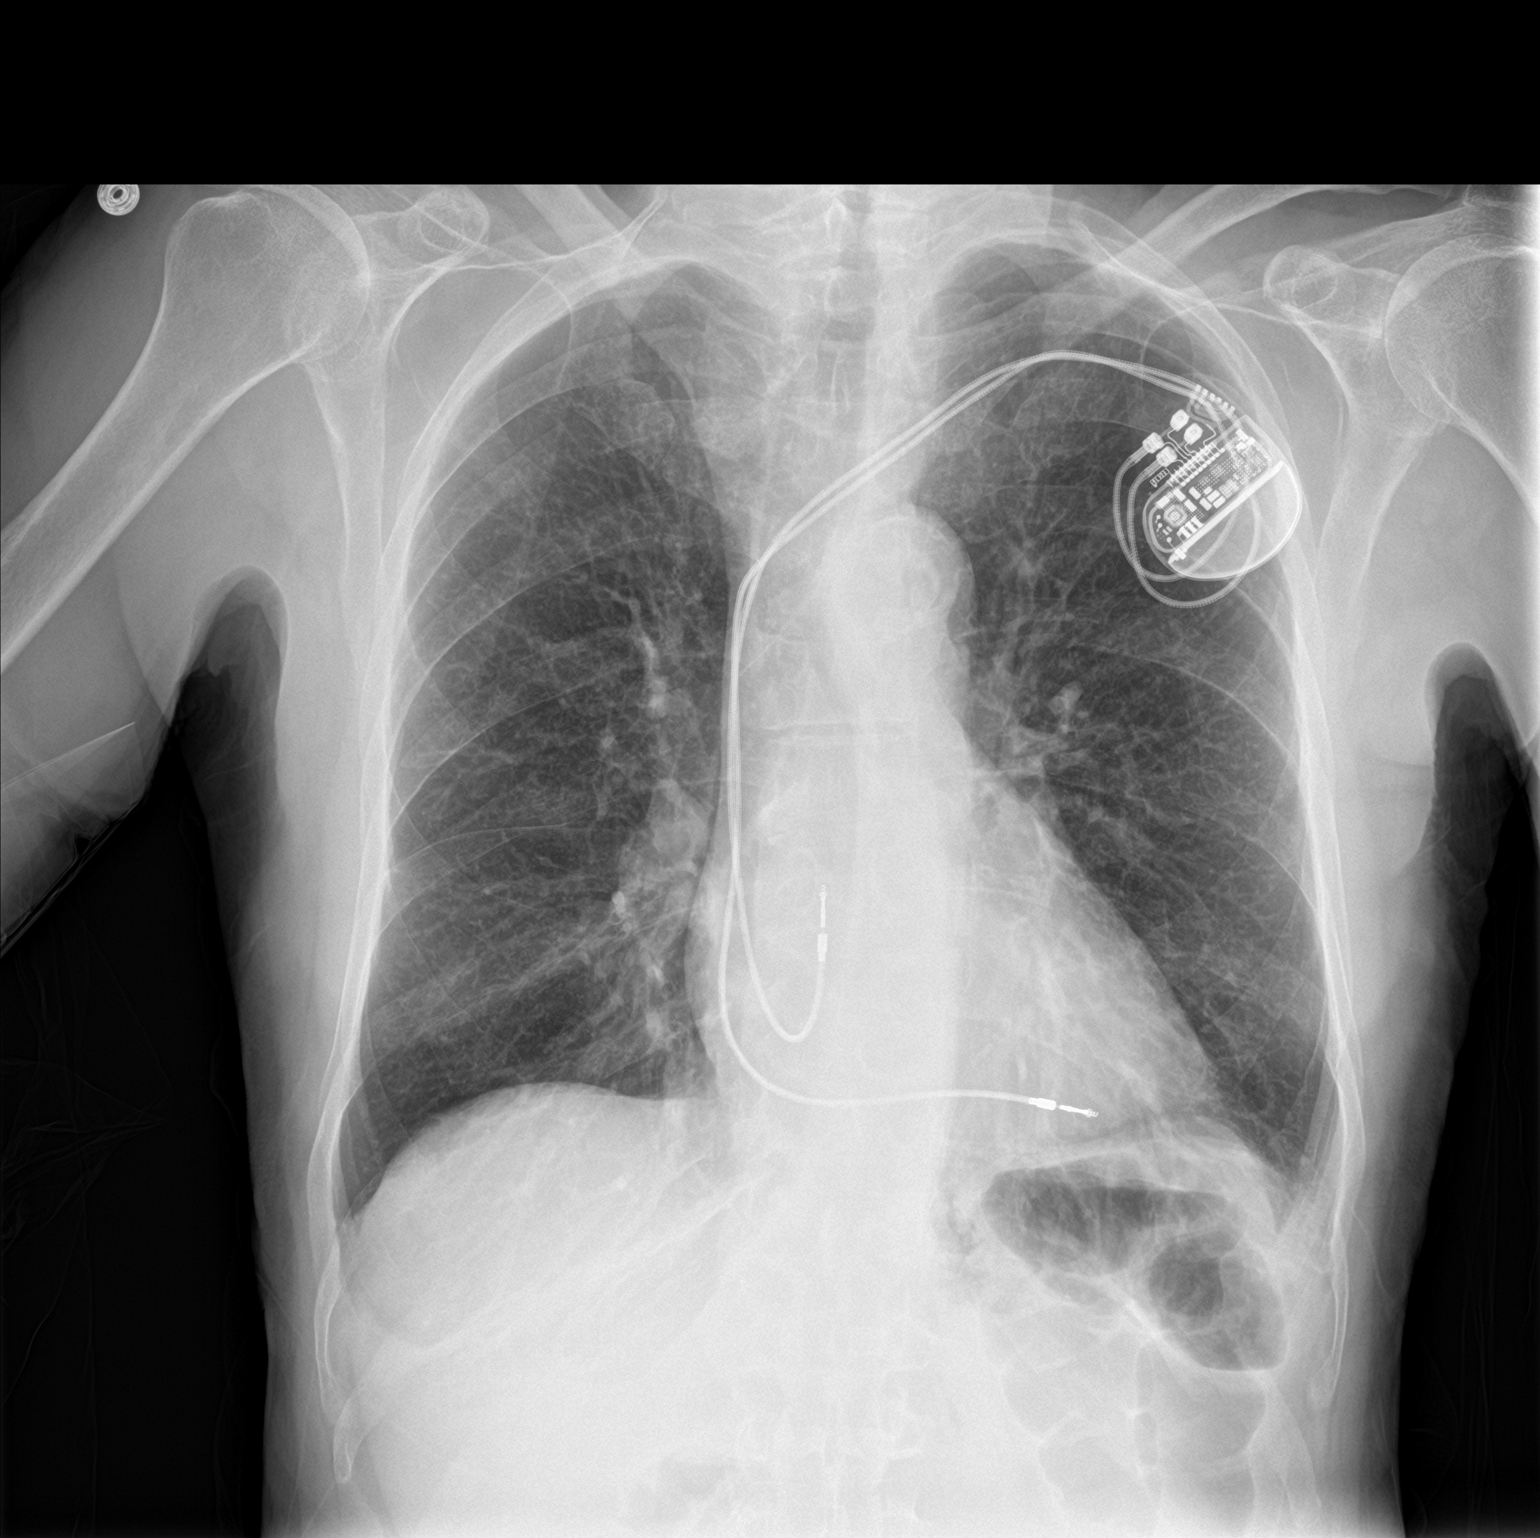

[chest lat]
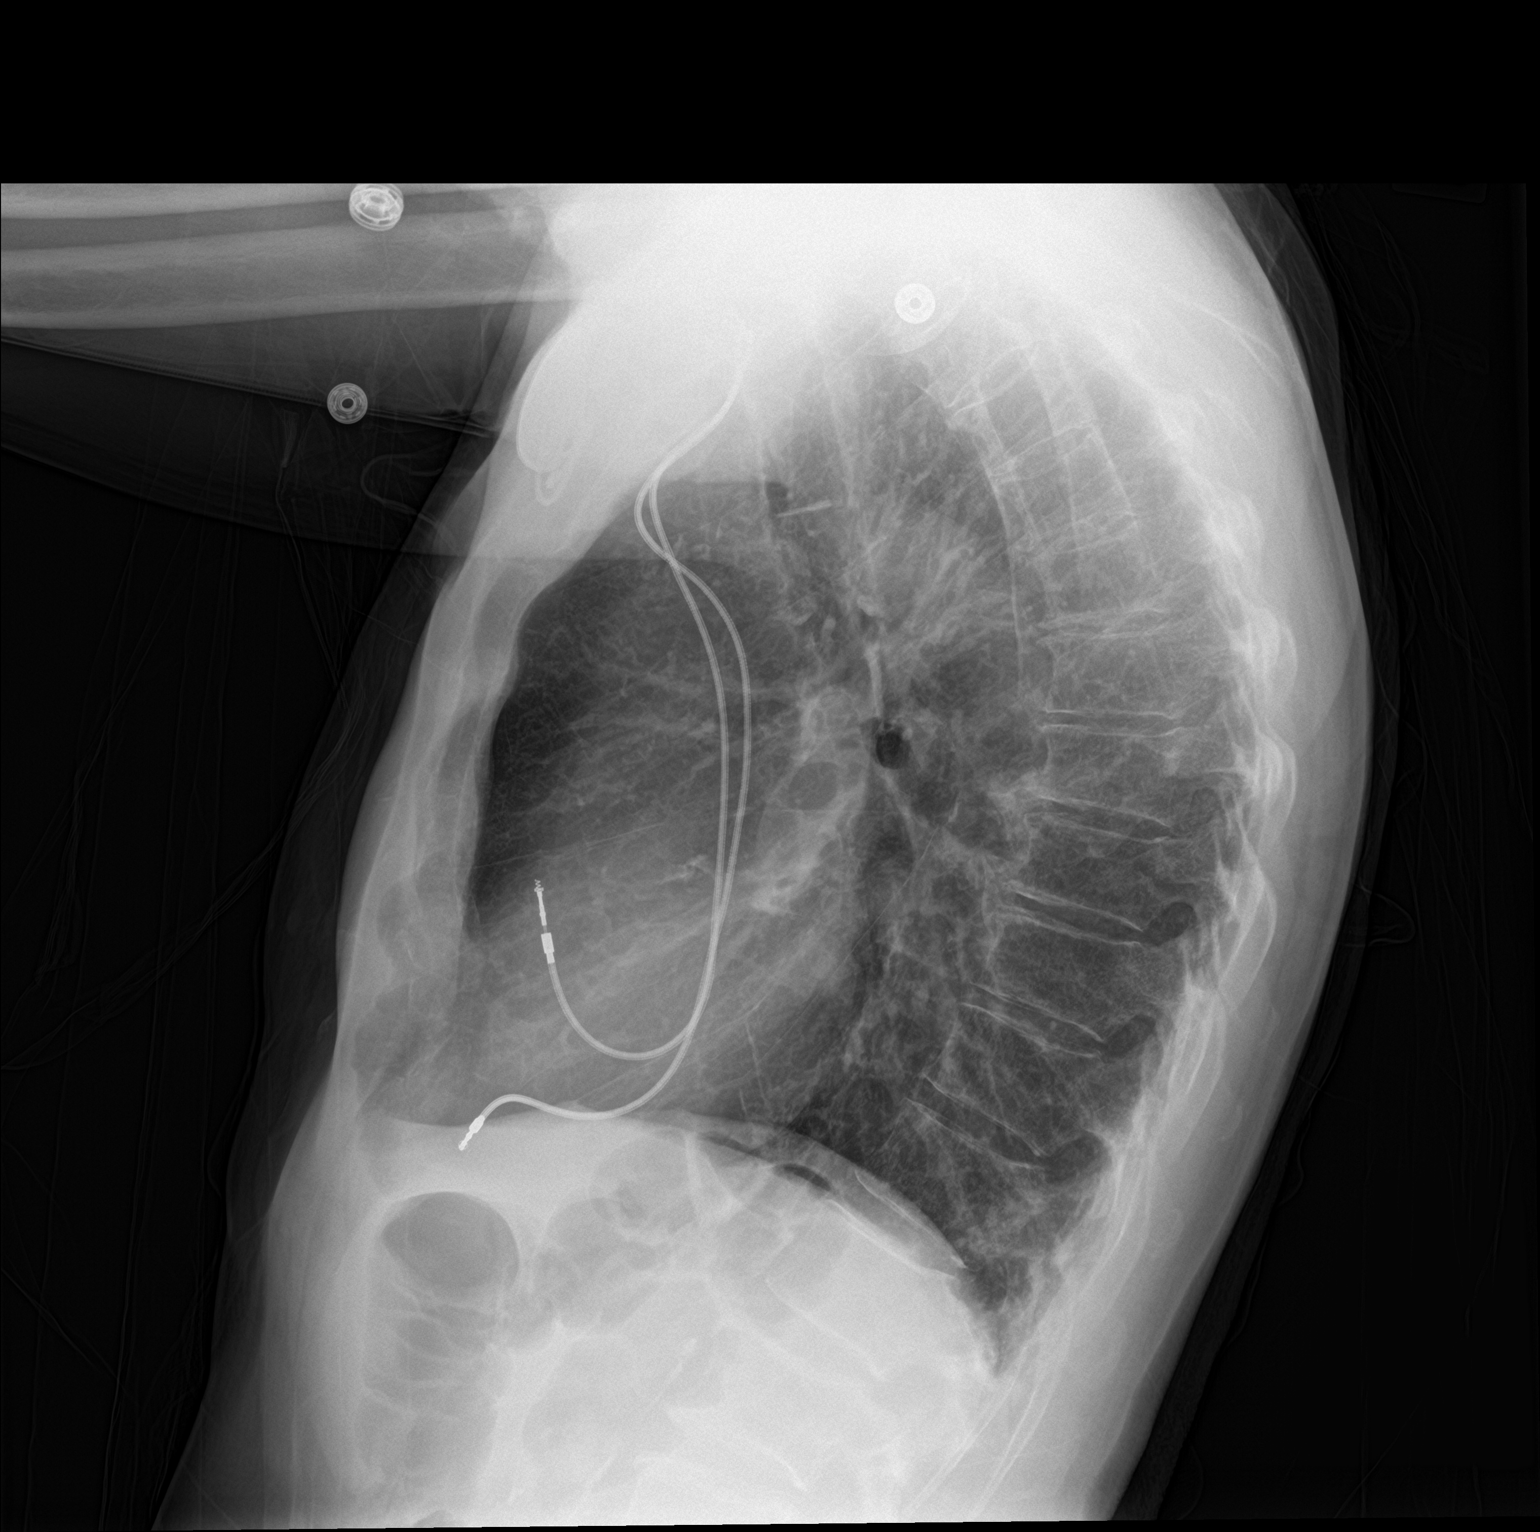

[2 of 2 positions shown; findings below may reference images not displayed]

FINDINGS: Cardiac pacemaker. Normal heart size and pulmonary vascularity. No
focal airspace disease or consolidation in the lungs. No blunting of
costophrenic angles. No pneumothorax. Mediastinal contours appear
intact. Degenerative changes in the spine. Calcification of the
aorta.
IMPRESSION: No evidence of active pulmonary disease.  Aortic atherosclerosis.

## 2019-11-28 ENCOUNTER — Ambulatory Visit (INDEPENDENT_AMBULATORY_CARE_PROVIDER_SITE_OTHER): Payer: Medicare Other

## 2019-11-28 DIAGNOSIS — I495 Sick sinus syndrome: Secondary | ICD-10-CM

## 2019-11-28 LAB — CUP PACEART REMOTE DEVICE CHECK
Battery Remaining Longevity: 30 mo
Battery Remaining Percentage: 47 %
Brady Statistic RA Percent Paced: 83 %
Brady Statistic RV Percent Paced: 1 %
Date Time Interrogation Session: 20211019014000
Implantable Lead Implant Date: 20150506
Implantable Lead Implant Date: 20150506
Implantable Lead Location: 753859
Implantable Lead Location: 753860
Implantable Lead Model: 4086
Implantable Lead Model: 4087
Implantable Lead Serial Number: 265257
Implantable Lead Serial Number: 310823
Implantable Pulse Generator Implant Date: 20150506
Lead Channel Impedance Value: 446 Ohm
Lead Channel Impedance Value: 882 Ohm
Lead Channel Setting Pacing Amplitude: 2 V
Lead Channel Setting Pacing Amplitude: 2.4 V
Lead Channel Setting Pacing Pulse Width: 0.4 ms
Lead Channel Setting Sensing Sensitivity: 2.5 mV
Pulse Gen Serial Number: 101958

## 2019-12-04 NOTE — Progress Notes (Signed)
Remote pacemaker transmission.   

## 2019-12-11 ENCOUNTER — Encounter (INDEPENDENT_AMBULATORY_CARE_PROVIDER_SITE_OTHER): Payer: Self-pay | Admitting: Ophthalmology

## 2019-12-11 ENCOUNTER — Other Ambulatory Visit: Payer: Self-pay

## 2019-12-11 ENCOUNTER — Ambulatory Visit (INDEPENDENT_AMBULATORY_CARE_PROVIDER_SITE_OTHER): Payer: Medicare Other | Admitting: Ophthalmology

## 2019-12-11 DIAGNOSIS — E119 Type 2 diabetes mellitus without complications: Secondary | ICD-10-CM | POA: Diagnosis not present

## 2019-12-11 DIAGNOSIS — H43813 Vitreous degeneration, bilateral: Secondary | ICD-10-CM | POA: Diagnosis not present

## 2019-12-11 NOTE — Assessment & Plan Note (Signed)
No detectable diabetic retinopathy OU,  Allow old follow-up examination for dilation every 2 years if patient has excellent hemoglobin A1c and no diabetic  Retinopathy detectableThe patient has diabetes without any evidence of retinopathy. The patient advised to maintain good blood glucose control, excellent blood pressure control, and favorable levels of cholesterol, low density lipoprotein, and high density lipoproteins. Follow up in 1 year was recommended. Explained that fluctuations in visual acuity , or "out of focus", may result from large variations of blood sugar control.

## 2019-12-11 NOTE — Progress Notes (Signed)
12/11/2019     CHIEF COMPLAINT Patient presents for Diabetic Eye Exam   HISTORY OF PRESENT ILLNESS: Alan [name] is a 86 y.o. male who presents to the clinic today for:   HPI    Diabetic Eye Exam    Vision is stable.  Associated Symptoms Negative for Flashes and Floaters.  Diabetes characteristics include Type 2.  Blood sugar level is controlled.  Last A1C 5.9.  I, the attending physician,  performed the HPI with the patient and updated documentation appropriately.          Comments    1 Year Diabetic Exam OU. OCT  Pt states vision has not changed. Denies FOL and floaters BGL: does not check regularly       Last edited by Tilda Franco on 12/11/2019  1:09 PM. (History)      Referring physician: Nicoletta Dress, MD [place],  [place] [address]  HISTORICAL INFORMATION:   Selected notes from the MEDICAL RECORD NUMBER    Lab Results  Component Value Date   HGBA1C 5.8 03/17/2010     CURRENT MEDICATIONS: No current outpatient medications on file. (Ophthalmic Drugs)   No current facility-administered medications for this visit. (Ophthalmic Drugs)   Current Outpatient Medications (Other)  Medication Sig  . ALPRAZolam (XANAX) 0.25 MG tablet Take 1 tablet by mouth at bedtime as needed. May take another tablet by mouth if awakens during the night  . aspirin 81 MG tablet Take 81 mg by mouth daily.    Marland Kitchen b complex vitamins tablet Take 1 tablet by mouth daily.  . Cholecalciferol (VITAMIN D PO) Take 2,000 Units by mouth daily.  . Cinnamon 500 MG capsule Take 500 mg by mouth daily.  . Coenzyme Q10 (CO Q 10) 100 MG CAPS Take 1 capsule by mouth daily.   . CRESTOR 20 MG tablet Take 20 mg by mouth daily.   . Cyanocobalamin (B-12 PO) Take 1 capsule by mouth daily.   Marland Kitchen donepezil (ARICEPT) 10 MG tablet Take 10 mg by mouth daily.  . Garlic 262 MG CAPS Take 600 mg by mouth daily.   Marland Kitchen MAGNESIUM PO Take 1 tablet by mouth daily.   . mirtazapine  (REMERON) 15 MG tablet Take 15 mg by mouth every evening.  . Omega-3 Fatty Acids (OMEGA 3 PO) Take 1 capsule by mouth daily as needed (vitamin supllement).  . vitamin C (ASCORBIC ACID) 500 MG tablet Take 500 mg by mouth daily.  . Xylometazoline HCl (OTRIVIN NA) Place 2 sprays into both nostrils daily as needed (nasal congestion). Nasal spray as needed twice daily   No current facility-administered medications for this visit. (Other)      REVIEW OF SYSTEMS: ROS    Positive for: Endocrine   Last edited by Tilda Franco on 12/11/2019  1:09 PM. (History)       ALLERGIES Allergies  Allergen Reactions  . Cefdinir Other (See Comments)    confusion  . Effexor [Venlafaxine] Other (See Comments)    "extremely dizzy"  . Sulfa Antibiotics Other (See Comments) and Swelling  . Amlodipine Besylate Swelling    Swelling of scrotum Swelling of scrotum  . Aliskiren Other (See Comments)    Pt doesn't remember  . Sulfamethoxazole     REACTION: throat swelled  . Tekturna Hct [Aliskiren-Hydrochlorothiazide]     Lower back pain    PAST MEDICAL HISTORY Past Medical History:  Diagnosis Date  . Anemia   . Anxiety  07/19/2008   Qualifier: Diagnosis of  By: Hollie Salk CMA, Estill Bamberg    . Arthritis   . Bradycardia    chronotropic incompetence  . Carotid stenosis    <60% 2012  . Diabetes mellitus    Borderline  . Diverticulosis   . Esophageal stricture   . Exercise intolerance   . Hemorrhoids   . Hernia   . Hypertension   . Lightheadedness 10/14/2010  . Peptic ulcer disease   . Prostate cancer ([place])   . Vitamin B 12 deficiency    Past Surgical History:  Procedure Laterality Date  . RETROPUBIC PROSTATECTOMY  2005   Radical  . SKIN SURGERY     basal cell carcinoma    FAMILY HISTORY Family History  Problem Relation Age of Onset  . Heart disease Mother   . Stroke Mother   . Cancer Father        prostate  . Stroke Father   . Cancer Paternal Grandfather        prostate    SOCIAL  HISTORY Social History   Tobacco Use  . Smoking status: Never Smoker  . Smokeless tobacco: Never Used  Vaping Use  . Vaping Use: Never used  Substance Use Topics  . Alcohol use: No  . Drug use: No         OPHTHALMIC EXAM: Base Eye Exam    Visual Acuity (Snellen - Linear)      Right Left   Dist cc 20/25 -1 20/20   Correction: Glasses       Tonometry (Tonopen, 1:13 PM)      Right Left   Pressure 16 13       Pupils      Pupils Dark Light Shape React APD   Right PERRL 3 2 Round Slow None   Left PERRL 3 2 Round Slow None       Visual Fields (Counting fingers)      Left Right    Full Full       Neuro/Psych    Oriented x3: Yes   Mood/Affect: Normal       Dilation    Both eyes: 1.0% Mydriacyl, 2.5% Phenylephrine @ 1:13 PM        Slit Lamp and Fundus Exam    External Exam      Right Left   External Normal Normal       Slit Lamp Exam      Right Left   Lids/Lashes Normal Normal   Conjunctiva/Sclera White and quiet White and quiet   Cornea Clear Clear   Anterior Chamber Deep and quiet Deep and quiet   Iris Round and reactive Round and reactive   Lens Centered posterior chamber intraocular lens Centered posterior chamber intraocular lens   Anterior Vitreous Normal Normal       Fundus Exam      Right Left   Posterior Vitreous Posterior vitreous detachment Posterior vitreous detachment   Disc Normal Normal   C/D Ratio 0.25 0.25   Macula Normal Normal   Vessels no DR no DR   Periphery Normal Normal          IMAGING AND PROCEDURES  Imaging and Procedures for 12/11/19  OCT, Retina - OU - Both Eyes       Right Eye Quality was good. Scan locations included subfoveal. Central Foveal Thickness: 291. Progression has been stable. Findings include normal foveal contour.   Left Eye Quality was good. Scan locations included subfoveal. Central Foveal Thickness: 286.  Progression has been stable. Findings include normal foveal contour.    Notes Incidental note of posterior vitreous detachment OU, no active maculopathy                ASSESSMENT/PLAN:  Diabetes mellitus without complication (HCC) No detectable diabetic retinopathy OU,  Allow old follow-up examination for dilation every 2 years if patient has excellent hemoglobin A1c and no diabetic  Retinopathy detectableThe patient has diabetes without any evidence of retinopathy. The patient advised to maintain good blood glucose control, excellent blood pressure control, and favorable levels of cholesterol, low density lipoprotein, and high density lipoproteins. Follow up in 1 year was recommended. Explained that fluctuations in visual acuity , or "out of focus", may result from large variations of blood sugar control.      ICD-10-CM   1. Diabetes mellitus without complication (HCC)  E28.0 OCT, Retina - OU - Both Eyes  2. Posterior vitreous detachment of both eyes  H43.813 OCT, Retina - OU - Both Eyes    1.  No detectable diabetic retinopathy OU, will observe  2.  3.  Ophthalmic Meds Ordered this visit:  No orders of the defined types were placed in this encounter.      Return in about 2 years (around 12/10/2021) for DILATE OU, OCT.  There are no Patient Instructions on file for this visit.   Explained the diagnoses, plan, and follow up with the patient and they expressed understanding.  Patient expressed understanding of the importance of proper follow up care.   Clent Demark Malin Sambrano M.D. Diseases & Surgery of the Retina and Vitreous Retina [facility] Diabetic [place] 12/11/19     Abbreviations: M myopia (nearsighted); A astigmatism; H hyperopia (farsighted); P presbyopia; Mrx spectacle prescription;  CTL contact lenses; OD right eye; OS left eye; OU both eyes  XT exotropia; ET esotropia; PEK punctate epithelial keratitis; PEE punctate epithelial erosions; DES dry eye syndrome; MGD meibomian gland dysfunction; ATs artificial tears; PFAT's preservative free  artificial tears; [place] nuclear sclerotic cataract; PSC posterior subcapsular cataract; ERM epi-retinal membrane; PVD posterior vitreous detachment; RD retinal detachment; DM diabetes mellitus; DR diabetic retinopathy; NPDR non-proliferative diabetic retinopathy; PDR proliferative diabetic retinopathy; CSME clinically significant macular edema; DME diabetic macular edema; dbh dot blot hemorrhages; CWS cotton wool spot; POAG primary open angle glaucoma; C/D cup-to-disc ratio; HVF humphrey visual field; GVF goldmann visual field; OCT optical coherence tomography; IOP intraocular pressure; BRVO Branch retinal vein occlusion; CRVO central retinal vein occlusion; CRAO central retinal artery occlusion; BRAO branch retinal artery occlusion; RT retinal tear; SB scleral buckle; PPV pars plana vitrectomy; VH Vitreous hemorrhage; PRP panretinal laser photocoagulation; IVK intravitreal kenalog; VMT vitreomacular traction; MH Macular hole;  NVD neovascularization of the disc; NVE neovascularization elsewhere; AREDS age related eye disease study; ARMD age related macular degeneration; POAG primary open angle glaucoma; EBMD epithelial/anterior basement membrane dystrophy; ACIOL anterior chamber intraocular lens; IOL intraocular lens; PCIOL posterior chamber intraocular lens; Phaco/IOL phacoemulsification with intraocular lens placement; [place] photorefractive keratectomy; LASIK laser assisted in situ keratomileusis; HTN hypertension; DM diabetes mellitus; COPD chronic obstructive pulmonary disease

## 2020-01-25 ENCOUNTER — Telehealth: Payer: Self-pay

## 2020-01-25 NOTE — Telephone Encounter (Signed)
Latitude alert received for 1 fast ventricular arrhythmia, duration 34 seconds, possible AVNRT? At last OV 08/22/19, known PAT, NSVT all appear atrially driven, 1 PMT but was not reproducable.   Called to assess and check medication compliance. Patient is not home, his wife Joaquim Lai will have him return a phone call.

## 2020-01-25 NOTE — Telephone Encounter (Signed)
Patient returned phone call. States he does not recall any symptoms or events. Patient is not taking any AAD or BB medications. Advised patient I will forward to Dr. Caryl Comes and we will be in touch with any changes. Verbalized understanding.

## 2020-01-29 NOTE — Telephone Encounter (Signed)
GM   Print this off and we can look at the mechanism in the am Thanks SK

## 2020-02-06 NOTE — Telephone Encounter (Signed)
Repeat Latitude alert for (2) AVNRT on 02/05/20 @ 20:07, longest in duration 44 seconds. Spoke to patient wife Scarlette Calico [facility]), states the patient is not home right now but she does not recall the patients voices any complaints. States he will call later today when he is available. No BB or AAD on file.

## 2020-02-12 NOTE — Telephone Encounter (Signed)
Pt and wife returned phone call.  Pt does not recall any cardiac symptoms recently.  Pt does not currently take any medications.  Given the increasing frequency of episodes pt agreeable to scheduling an upcoming appt with Dr. Graciela Husbands.  Advised I would forward information to him and also to scheduling to contact for appt.

## 2020-02-12 NOTE — Telephone Encounter (Signed)
Another alert has been received for similar episodes.Attempted to reach pt, no answer left VM requesting callback.     Alert for 2 VHR episodes logged 14 seconds and 9 minutes 53 seconds w/ rates 160's to 170's bpm; EGM's suggest 1 is SVT and 1 is AVNRT

## 2020-02-14 NOTE — Telephone Encounter (Signed)
Appointment has been scheduled for 02/27/2020 at 1210pm with Otilio Saber, PA-C.  Pt is aware.

## 2020-02-27 ENCOUNTER — Encounter: Payer: Medicare Other | Admitting: Student

## 2020-02-27 ENCOUNTER — Ambulatory Visit (INDEPENDENT_AMBULATORY_CARE_PROVIDER_SITE_OTHER): Payer: Medicare Other

## 2020-02-27 DIAGNOSIS — I471 Supraventricular tachycardia: Secondary | ICD-10-CM

## 2020-02-27 LAB — CUP PACEART REMOTE DEVICE CHECK
Battery Remaining Longevity: 24 mo
Battery Remaining Percentage: 42 %
Brady Statistic RA Percent Paced: 82 %
Brady Statistic RV Percent Paced: 1 %
Date Time Interrogation Session: 20220118020600
Implantable Lead Implant Date: 20150506
Implantable Lead Implant Date: 20150506
Implantable Lead Location: 753859
Implantable Lead Location: 753860
Implantable Lead Model: 4086
Implantable Lead Model: 4087
Implantable Lead Serial Number: 265257
Implantable Lead Serial Number: 310823
Implantable Pulse Generator Implant Date: 20150506
Lead Channel Impedance Value: 451 Ohm
Lead Channel Impedance Value: 919 Ohm
Lead Channel Setting Pacing Amplitude: 2 V
Lead Channel Setting Pacing Amplitude: 2.4 V
Lead Channel Setting Pacing Pulse Width: 0.4 ms
Lead Channel Setting Sensing Sensitivity: 2.5 mV
Pulse Gen Serial Number: 101958

## 2020-02-28 ENCOUNTER — Other Ambulatory Visit: Payer: Self-pay

## 2020-02-28 ENCOUNTER — Ambulatory Visit (INDEPENDENT_AMBULATORY_CARE_PROVIDER_SITE_OTHER): Payer: Medicare Other | Admitting: Student

## 2020-02-28 ENCOUNTER — Encounter: Payer: Self-pay | Admitting: Student

## 2020-02-28 VITALS — BP 122/70 | HR 74 | Ht 67.0 in | Wt 145.0 lb

## 2020-02-28 DIAGNOSIS — I495 Sick sinus syndrome: Secondary | ICD-10-CM | POA: Diagnosis not present

## 2020-02-28 DIAGNOSIS — I10 Essential (primary) hypertension: Secondary | ICD-10-CM | POA: Diagnosis not present

## 2020-02-28 DIAGNOSIS — I471 Supraventricular tachycardia, unspecified: Secondary | ICD-10-CM

## 2020-02-28 DIAGNOSIS — Z950 Presence of cardiac pacemaker: Secondary | ICD-10-CM | POA: Diagnosis not present

## 2020-02-28 LAB — CUP PACEART INCLINIC DEVICE CHECK
Date Time Interrogation Session: 20220119143017
Implantable Lead Implant Date: 20150506
Implantable Lead Implant Date: 20150506
Implantable Lead Location: 753859
Implantable Lead Location: 753860
Implantable Lead Model: 4086
Implantable Lead Model: 4087
Implantable Lead Serial Number: 265257
Implantable Lead Serial Number: 310823
Implantable Pulse Generator Implant Date: 20150506
Lead Channel Impedance Value: 443 Ohm
Lead Channel Impedance Value: 887 Ohm
Lead Channel Pacing Threshold Amplitude: 0.5 V
Lead Channel Pacing Threshold Amplitude: 0.8 V
Lead Channel Pacing Threshold Pulse Width: 0.4 ms
Lead Channel Pacing Threshold Pulse Width: 0.4 ms
Lead Channel Sensing Intrinsic Amplitude: 15.5 mV
Lead Channel Sensing Intrinsic Amplitude: 4.8 mV
Lead Channel Setting Pacing Amplitude: 2 V
Lead Channel Setting Pacing Amplitude: 2.4 V
Lead Channel Setting Pacing Pulse Width: 0.4 ms
Lead Channel Setting Sensing Sensitivity: 2.5 mV
Pulse Gen Serial Number: 101958

## 2020-02-28 NOTE — Progress Notes (Signed)
Electrophysiology Office Note Date: 02/28/2020  ID:  DWYNE VADNEY, DOB 07/18/1931, MRN [id number]  PCP: Nicoletta Dress, MD Primary Cardiologist: No primary care provider on file. Electrophysiologist: [name], MD   CC: Pacemaker follow-up  FINNEAS STENGEL is a 87 y.o. male seen today for [name], MD for routine electrophysiology followup.  Since last being seen in our clinic the patient reports doing very well.  he denies chest pain, palpitations, dyspnea, PND, orthopnea, nausea, vomiting, dizziness, syncope, edema, weight gain, or early satiety.  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 Rosita Fire) for symptomatic bradycardia  Past Medical History:  Diagnosis Date   Anemia    Anxiety 07/19/2008   Qualifier: Diagnosis of  By: Hollie Salk CMA, Estill Bamberg     Arthritis    Bradycardia    chronotropic incompetence   Carotid stenosis    <60% 2012   Diabetes mellitus    Borderline   Diverticulosis    Esophageal stricture    Exercise intolerance    Hemorrhoids    Hernia    Hypertension    Lightheadedness 10/14/2010   Peptic ulcer disease    Prostate cancer ([place])    Vitamin B 12 deficiency    Past Surgical History:  Procedure Laterality Date   RETROPUBIC PROSTATECTOMY  2005   Radical   SKIN SURGERY     basal cell carcinoma    Current Outpatient Medications  Medication Sig Dispense Refill   alclomethasone (ACLOVATE) 0.05 % ointment Apply topically as needed.     ALPRAZolam (XANAX) 0.25 MG tablet Take 1 tablet by mouth at bedtime as needed. May take another tablet by mouth if awakens during the night  5   aspirin 81 MG tablet Take 81 mg by mouth daily.     b complex vitamins tablet Take 1 tablet by mouth daily.     Cholecalciferol (VITAMIN D PO) Take 2,000 Units by mouth daily.     Cinnamon 500 MG capsule Take 500 mg by mouth daily.     Coenzyme Q10 (CO Q 10) 100 MG CAPS Take 1 capsule by mouth daily.       CRESTOR 20 MG tablet Take 20 mg by mouth daily.   3   Cyanocobalamin (B-12 PO) Take 1 capsule by mouth daily.      donepezil (ARICEPT) 10 MG tablet Take 10 mg by mouth daily.     Garlic 0000000 MG CAPS Take 600 mg by mouth daily.      MAGNESIUM PO Take 1 tablet by mouth daily.      mirtazapine (REMERON) 15 MG tablet Take 15 mg by mouth every evening.  2   Omega-3 Fatty Acids (OMEGA 3 PO) Take 1 capsule by mouth daily as needed (vitamin supllement).     vitamin C (ASCORBIC ACID) 500 MG tablet Take 500 mg by mouth daily.     Xylometazoline HCl (OTRIVIN NA) Place 2 sprays into both nostrils daily as needed (nasal congestion). Nasal spray as needed twice daily     No current facility-administered medications for this visit.    Allergies:   Cefdinir, Effexor [venlafaxine], Sulfa antibiotics, Amlodipine besylate, Aliskiren, Sulfamethoxazole, and Tekturna hct [aliskiren-hydrochlorothiazide]   Social History: Social History   Socioeconomic History   Marital status: Married    Spouse name: Not on file   Number of children: 1   Years of education: Not on file   Highest education level: Not on file  Occupational History   Occupation:  Retired    Comment: Chief Financial Officer  Tobacco Use   Smoking status: Never Smoker   Smokeless tobacco: Never Used  Scientific laboratory technician Use: Never used  Substance and Sexual Activity   Alcohol use: No   Drug use: No   Sexual activity: Not on file  Other Topics Concern   Not on file  Social History Narrative   Married, 1 son   Right handed   BS    No caffeine   Social Determinants of Health   Financial Resource Strain: Not on file  Food Insecurity: Not on file  Transportation Needs: Not on file  Physical Activity: Not on file  Stress: Not on file  Social Connections: Not on file  Intimate Partner Violence: Not on file    Family History: Family History  Problem Relation Age of Onset   Heart disease Mother    Stroke Mother    Cancer  Father        prostate   Stroke Father    Cancer Paternal Grandfather        prostate     Review of Systems: All other systems reviewed and are otherwise negative except as noted above.  Physical Exam: Vitals:   02/28/20 1222  BP: 122/70  Pulse: 74  SpO2: 96%  Weight: 145 lb (65.8 kg)  Height: 5\' 7"  (1.702 m)     GEN- The patient is well appearing, alert and oriented x 3 today.   HEENT: normocephalic, atraumatic; sclera clear, conjunctiva pink; hearing intact; oropharynx clear; neck supple  Lungs- Clear to ausculation bilaterally, normal work of breathing.  No wheezes, rales, rhonchi Heart- Regular rate and rhythm, no murmurs, rubs or gallops  GI- soft, non-tender, non-distended, bowel sounds present  Extremities- no clubbing or cyanosis. No edema MS- no significant deformity or atrophy Skin- warm and dry, no rash or lesion; PPM pocket well healed Psych- euthymic mood, full affect Neuro- strength and sensation are intact  PPM Interrogation- reviewed in detail today,  See PACEART report  EKG:  EKG is not ordered today.   Recent Labs: No results found for requested labs within last 8760 hours.   Wt Readings from Last 3 Encounters:  02/28/20 145 lb (65.8 kg)  08/22/19 132 lb (59.9 kg)  12/13/17 139 lb (63 kg)     Other studies Reviewed: Additional studies/ records that were reviewed today include: Previous EP office notes, Previous remote checks, Most recent labwork.   Assessment and Plan:  1. Symptomatic bradycardia s/p [employer] PPM  Normal PPM function See Claudia Desanctis Art report No changes today  2. SVT Asymptomatic, but burden increasing (though episode lengths remain low. Longest 9 minutes, asymptomatic, other episodes in the 10-20 second range) He was able to shovel snow for several hours this week without significant episodes or any symptoms.  He would prefer to follow conservatively at this time. If he becomes symptomatic or burdens increase, he is  willing to consider low dose BB vs CCB. (EF normal 2018) and will call us if this is the case.  3. HTN Medications as above.   Current medicines are reviewed at length with the patient today.   The patient does not have concerns regarding his medicines.  The following changes were made today:  none  Labs/ tests ordered today include:  Orders Placed This Encounter  Procedures   CUP PACEART INCLINIC DEVICE CHECK    Disposition:   Follow up with Dr. Caryl Comes in July as scheduled on recall. Sooner  with symptoms. Overall will continue conservative management given his advanced age.     Jacalyn Lefevre, PA-C  02/28/2020 2:38 PM  [place] [address] [phone] (office) [phone] (fax)

## 2020-02-28 NOTE — Patient Instructions (Signed)
Medication Instructions:  *If you need a refill on your cardiac medications before your next appointment, please call your pharmacy*  Follow-Up: At [facility], you and your health needs are our priority.  As part of our continuing mission to provide you with exceptional heart care, we have created designated Provider Care Teams.  These Care Teams include your primary Cardiologist (physician) and Advanced Practice Providers (APPs -  Physician Assistants and Nurse Practitioners) who all work together to provide you with the care you need, when you need it.  We recommend signing up for the patient portal called "MyChart".  Sign up information is provided on this After Visit Summary.  MyChart is used to connect with patients for Virtual Visits (Telemedicine).  Patients are able to view lab/test results, encounter notes, upcoming appointments, etc.  Non-urgent messages can be sent to your provider as well.   To learn more about what you can do with MyChart, go to [email or web address].    Your next appointment:   Your physician wants you to follow-up in: July 2022 with Dr. Caryl Comes. You will receive a reminder letter in the mail two months in advance. If you don't receive a letter, please call our office to schedule the follow-up appointment.  Remote monitoring is used to monitor your Pacemaker from home. This monitoring reduces the number of office visits required to check your device to one time per year. It allows [place] to keep an eye on the functioning of your device to ensure it is working properly. You are scheduled for a device check from home on 05/28/20. You may send your transmission at any time that day. If you have a wireless device, the transmission will be sent automatically. After your physician reviews your transmission, you will receive a postcard with your next transmission date.  The format for your next appointment:   In Person with [name], MD

## 2020-03-12 NOTE — Progress Notes (Signed)
Remote pacemaker transmission.   

## 2020-05-28 ENCOUNTER — Ambulatory Visit (INDEPENDENT_AMBULATORY_CARE_PROVIDER_SITE_OTHER): Payer: Medicare Other

## 2020-05-28 DIAGNOSIS — I471 Supraventricular tachycardia: Secondary | ICD-10-CM | POA: Diagnosis not present

## 2020-05-28 LAB — CUP PACEART REMOTE DEVICE CHECK
Battery Remaining Longevity: 18 mo
Battery Remaining Percentage: 35 %
Brady Statistic RA Percent Paced: 76 %
Brady Statistic RV Percent Paced: 1 %
Date Time Interrogation Session: 20220419011500
Implantable Lead Implant Date: 20150506
Implantable Lead Implant Date: 20150506
Implantable Lead Location: 753859
Implantable Lead Location: 753860
Implantable Lead Model: 4086
Implantable Lead Model: 4087
Implantable Lead Serial Number: 265257
Implantable Lead Serial Number: 310823
Implantable Pulse Generator Implant Date: 20150506
Lead Channel Impedance Value: 458 Ohm
Lead Channel Impedance Value: 924 Ohm
Lead Channel Setting Pacing Amplitude: 2 V
Lead Channel Setting Pacing Amplitude: 2.4 V
Lead Channel Setting Pacing Pulse Width: 0.4 ms
Lead Channel Setting Sensing Sensitivity: 2.5 mV
Pulse Gen Serial Number: 101958

## 2020-06-13 NOTE — Progress Notes (Signed)
Remote pacemaker transmission.   

## 2020-08-27 ENCOUNTER — Ambulatory Visit (INDEPENDENT_AMBULATORY_CARE_PROVIDER_SITE_OTHER): Payer: Medicare Other

## 2020-08-27 DIAGNOSIS — Z950 Presence of cardiac pacemaker: Secondary | ICD-10-CM | POA: Diagnosis not present

## 2020-08-27 LAB — CUP PACEART REMOTE DEVICE CHECK
Battery Remaining Longevity: 18 mo
Battery Remaining Percentage: 32 %
Brady Statistic RA Percent Paced: 74 %
Brady Statistic RV Percent Paced: 1 %
Date Time Interrogation Session: 20220719022000
Implantable Lead Implant Date: 20150506
Implantable Lead Implant Date: 20150506
Implantable Lead Location: 753859
Implantable Lead Location: 753860
Implantable Lead Model: 4086
Implantable Lead Model: 4087
Implantable Lead Serial Number: 265257
Implantable Lead Serial Number: 310823
Implantable Pulse Generator Implant Date: 20150506
Lead Channel Impedance Value: 440 Ohm
Lead Channel Impedance Value: 933 Ohm
Lead Channel Setting Pacing Amplitude: 2 V
Lead Channel Setting Pacing Amplitude: 2.4 V
Lead Channel Setting Pacing Pulse Width: 0.4 ms
Lead Channel Setting Sensing Sensitivity: 2.5 mV
Pulse Gen Serial Number: 101958

## 2020-09-05 ENCOUNTER — Encounter (INDEPENDENT_AMBULATORY_CARE_PROVIDER_SITE_OTHER): Payer: Medicare Other | Admitting: Ophthalmology

## 2020-09-16 ENCOUNTER — Encounter: Payer: Medicare Other | Admitting: Internal Medicine

## 2020-09-16 DIAGNOSIS — I471 Supraventricular tachycardia: Secondary | ICD-10-CM | POA: Insufficient documentation

## 2020-09-16 DIAGNOSIS — Z950 Presence of cardiac pacemaker: Secondary | ICD-10-CM

## 2020-09-16 DIAGNOSIS — I495 Sick sinus syndrome: Secondary | ICD-10-CM

## 2020-09-19 NOTE — Progress Notes (Signed)
Remote pacemaker transmission.   

## 2020-09-20 ENCOUNTER — Other Ambulatory Visit: Payer: Self-pay

## 2020-09-20 ENCOUNTER — Encounter: Payer: Self-pay | Admitting: Internal Medicine

## 2020-09-20 ENCOUNTER — Ambulatory Visit (INDEPENDENT_AMBULATORY_CARE_PROVIDER_SITE_OTHER): Payer: Medicare Other | Admitting: Internal Medicine

## 2020-09-20 VITALS — BP 128/72 | HR 85 | Ht 67.0 in | Wt 142.6 lb

## 2020-09-20 DIAGNOSIS — Z950 Presence of cardiac pacemaker: Secondary | ICD-10-CM | POA: Diagnosis not present

## 2020-09-20 DIAGNOSIS — I495 Sick sinus syndrome: Secondary | ICD-10-CM | POA: Diagnosis not present

## 2020-09-20 DIAGNOSIS — I471 Supraventricular tachycardia, unspecified: Secondary | ICD-10-CM

## 2020-09-20 DIAGNOSIS — I498 Other specified cardiac arrhythmias: Secondary | ICD-10-CM

## 2020-09-20 NOTE — Progress Notes (Signed)
Patient Care Team: Nicoletta Dress, MD as PCP - General (Internal Medicine) Deboraha Sprang, MD as PCP - Electrophysiology (Cardiology)   HPI  Alan [name] is a 87 y.o. male Seen in follow-up for sinus bradycardia and chronotropic incompetence. was seen  at [facility]. Multiple discussions find that he received a Chiropractor at [facility] 2014  Also noted to have recurrent abrupt onset offset tachycardia which has been not too problematic identified on his device but largely without symptoms  The patient denies chest pain, shortness of breath, nocturnal dyspnea, orthopnea or peripheral edema.  There have been no palpitations, lightheadedness or syncope.    Some dementia   DATE TEST EF   7/18 Echo   53% %                Past Medical History:  Diagnosis Date   Anemia    Anxiety 07/19/2008   Qualifier: Diagnosis of  By: Hollie Salk CMA, Estill Bamberg     Arthritis    Bradycardia    chronotropic incompetence   Carotid stenosis    <60% 2012   Diabetes mellitus    Borderline   Diverticulosis    Esophageal stricture    Exercise intolerance    Hemorrhoids    Hernia    Hypertension    Lightheadedness 10/14/2010   Peptic ulcer disease    Prostate cancer ([place])    Vitamin B 12 deficiency     Past Surgical History:  Procedure Laterality Date   RETROPUBIC PROSTATECTOMY  2005   Radical   SKIN SURGERY     basal cell carcinoma    Current Outpatient Medications  Medication Sig Dispense Refill   alclomethasone (ACLOVATE) 0.05 % ointment Apply topically as needed.     ALPRAZolam (XANAX) 0.25 MG tablet Take 1 tablet by mouth at bedtime as needed. May take another tablet by mouth if awakens during the night  5   aspirin 81 MG tablet Take 81 mg by mouth daily.     b complex vitamins tablet Take 1 tablet by mouth daily.     Cholecalciferol (VITAMIN D PO) Take 2,000 Units by mouth daily.     Cinnamon 500 MG capsule Take 500 mg by mouth daily.     Coenzyme Q10  (CO Q 10) 100 MG CAPS Take 1 capsule by mouth daily.      CRESTOR 20 MG tablet Take 20 mg by mouth daily.   3   Cyanocobalamin (B-12 PO) Take 1 capsule by mouth daily.      donepezil (ARICEPT) 10 MG tablet Take 10 mg by mouth daily.     Garlic 0000000 MG CAPS Take 600 mg by mouth daily.      MAGNESIUM PO Take 1 tablet by mouth daily.      mirtazapine (REMERON) 15 MG tablet Take 15 mg by mouth every evening.  2   Omega-3 Fatty Acids (OMEGA 3 PO) Take 1 capsule by mouth daily as needed (vitamin supllement).     vitamin C (ASCORBIC ACID) 500 MG tablet Take 500 mg by mouth daily.     Xylometazoline HCl (OTRIVIN NA) Place 2 sprays into both nostrils daily as needed (nasal congestion). Nasal spray as needed twice daily     No current facility-administered medications for this visit.    Allergies  Allergen Reactions   Cefdinir Other (See Comments)    confusion   Effexor [Venlafaxine] Other (See Comments)    "extremely dizzy"  Sulfa Antibiotics Other (See Comments) and Swelling   Amlodipine Besylate Swelling    Swelling of scrotum Swelling of scrotum   Aliskiren Other (See Comments)    Pt doesn't remember   Sulfamethoxazole     REACTION: throat swelled   Tekturna Hct [Aliskiren-Hydrochlorothiazide]     Lower back pain      Review of Systems negative except from HPI and PMH  Physical Exam BP 128/72   Pulse 85   Ht '5\' 7"'$  (1.702 m)   Wt 142 lb 9.6 oz (64.7 kg)   SpO2 95%   BMI 22.33 kg/m  Well developed and well nourished in no acute distress HENT normal Neck supple with JVP-flat Clear Device pocket well healed; without hematoma or erythema.  There is no tethering  Regular rate and rhythm, no murmur Abd-soft with active BS No Clubbing cyanosis  edema Skin-warm and dry A & Oriented  Grossly normal sensory and motor function  ECG A pacing 85 14/09/34   Assessment and  Plan  Sinus node dysfunction  Pacemaker-[employer]   Depression   Supraventricular  tachycardia     Device function is normal.  Approaching ERI.  Chronotropic incompetence is an issue but will regulated by his pacemaker so that he is not functionally limited at this time.  Some interval atrial tachycardia.  Not symptomatic.  We will not treat.

## 2020-09-20 NOTE — Patient Instructions (Signed)
Medication Instructions:  Your physician recommends that you continue on your current medications as directed. Please refer to the Current Medication list given to you today.   Labwork: None ordered.  Testing/Procedures: None ordered.  Follow-Up: Your physician recommends that you schedule a follow-up appointment in:   9 months with Tommye Standard or Oda Kilts  Any Other Special Instructions Will Be Listed Below (If Applicable).     If you need a refill on your cardiac medications before your next appointment, please call your pharmacy.

## 2020-11-26 ENCOUNTER — Ambulatory Visit (INDEPENDENT_AMBULATORY_CARE_PROVIDER_SITE_OTHER): Payer: Medicare Other

## 2020-11-26 DIAGNOSIS — I495 Sick sinus syndrome: Secondary | ICD-10-CM | POA: Diagnosis not present

## 2020-11-26 LAB — CUP PACEART REMOTE DEVICE CHECK
Battery Remaining Longevity: 12 mo
Battery Remaining Percentage: 23 %
Brady Statistic RA Percent Paced: 73 %
Brady Statistic RV Percent Paced: 1 %
Date Time Interrogation Session: 20221018025800
Implantable Lead Implant Date: 20150506
Implantable Lead Implant Date: 20150506
Implantable Lead Location: 753859
Implantable Lead Location: 753860
Implantable Lead Model: 4086
Implantable Lead Model: 4087
Implantable Lead Serial Number: 265257
Implantable Lead Serial Number: 310823
Implantable Pulse Generator Implant Date: 20150506
Lead Channel Impedance Value: 460 Ohm
Lead Channel Impedance Value: 985 Ohm
Lead Channel Setting Pacing Amplitude: 2 V
Lead Channel Setting Pacing Amplitude: 2.4 V
Lead Channel Setting Pacing Pulse Width: 0.4 ms
Lead Channel Setting Sensing Sensitivity: 2.5 mV
Pulse Gen Serial Number: 101958

## 2020-12-05 NOTE — Progress Notes (Signed)
Remote pacemaker transmission.   

## 2021-02-07 ENCOUNTER — Telehealth: Payer: Self-pay | Admitting: Internal Medicine

## 2021-02-07 MED ORDER — AMIODARONE HCL 200 MG PO TABS: 200.0000 mg | ORAL_TABLET | Freq: Two times a day (BID) | ORAL | 3 refills | Status: DC

## 2021-02-07 NOTE — Telephone Encounter (Signed)
.  Patient c/o Palpitations:  High priority if patient c/o lightheadedness, shortness of breath, or chest pain  How long have you had palpitations/irregular HR/ Afib? Are you having the symptoms now? Heart beating extremely fast - went to [facility] ER yesterday afternoon  Are you currently experiencing lightheadedness, SOB or CP? Breathing really heavy  Do you have a history of afib (atrial fibrillation) or irregular heart rhythm?   Have you checked your BP or HR? (document readings if available): 130/70  Are you experiencing any other symptoms? No- he was told to see Dr  Caryl Comes-  I made an appointment for 02-13-21 with Oda Kilts- Please call to evaluate

## 2021-02-07 NOTE — Telephone Encounter (Signed)
Returned phone call to [facility]  Pt went to [facility] yesterday with heart racing.    Pt has h/o SVT.  EC is going to [facility] this morning.  She will have them fax records from yesterday to device clinic fax #.  She will then go to Pt's house and send a remote transmission.  She has device clinic phone #.  After information received will review and discuss with Dr. Caryl Comes.

## 2021-02-07 NOTE — Telephone Encounter (Signed)
Call placed to [facility] ER and spoke with charge nurse.  Per charge nurse Pt arrived to ER last evening.  Upon arrival BP and HR were within normal limits.  Lab work WNL.  Family had advised that prior to arrival Pt's heart rate got up to 200 BPM and a magnet was placed over Pt's pacemaker.  Device interrogation received showing increased episodes of SVT (reviewed by Dr. Caryl Comes).  Per Dr. Daphene Jaeger increased symptomatic tachycardia start patient on amiodarone 200 mg PO BID and close f/u with EP APP.  EC was notified of new orders.  Advised to keep f/u already scheduled with AT.  All questions answered.  EC in agreement with plan.

## 2021-02-07 NOTE — Telephone Encounter (Signed)
I spoke with the patient wife. I had them send a transmission with their home remote monitor. I printed the report for Sonia Baller, rn.   The patient wife also states the fax number for [facility] is [phone] and the nurse she spoke with direct number is [phone].  The patient wife states she can be reached at [phone].

## 2021-02-13 ENCOUNTER — Other Ambulatory Visit: Payer: Self-pay

## 2021-02-13 ENCOUNTER — Encounter: Payer: Self-pay | Admitting: Student

## 2021-02-13 ENCOUNTER — Ambulatory Visit (INDEPENDENT_AMBULATORY_CARE_PROVIDER_SITE_OTHER): Payer: Medicare Other | Admitting: Student

## 2021-02-13 VITALS — BP 130/80 | HR 75 | Ht 67.0 in | Wt 148.0 lb

## 2021-02-13 DIAGNOSIS — I471 Supraventricular tachycardia: Secondary | ICD-10-CM | POA: Diagnosis not present

## 2021-02-13 DIAGNOSIS — I495 Sick sinus syndrome: Secondary | ICD-10-CM | POA: Diagnosis not present

## 2021-02-13 LAB — COMPREHENSIVE METABOLIC PANEL
ALT: 15 IU/L (ref 0–44)
AST: 32 IU/L (ref 0–40)
Albumin/Globulin Ratio: 2.6 — ABNORMAL HIGH (ref 1.2–2.2)
Albumin: 4.7 g/dL — ABNORMAL HIGH (ref 3.6–4.6)
Alkaline Phosphatase: 103 IU/L (ref 44–121)
BUN/Creatinine Ratio: 15 (ref 10–24)
BUN: 16 mg/dL (ref 8–27)
Bilirubin Total: 0.3 mg/dL (ref 0.0–1.2)
CO2: 25 mmol/L (ref 20–29)
Calcium: 9.3 mg/dL (ref 8.6–10.2)
Chloride: 104 mmol/L (ref 96–106)
Creatinine, Ser: 1.06 mg/dL (ref 0.76–1.27)
Globulin, Total: 1.8 g/dL (ref 1.5–4.5)
Glucose: 100 mg/dL — ABNORMAL HIGH (ref 70–99)
Potassium: 4.1 mmol/L (ref 3.5–5.2)
Sodium: 143 mmol/L (ref 134–144)
Total Protein: 6.5 g/dL (ref 6.0–8.5)
eGFR: 68 mL/min/{1.73_m2} (ref >59)

## 2021-02-13 LAB — CUP PACEART INCLINIC DEVICE CHECK
Date Time Interrogation Session: 20230105095649
Implantable Lead Implant Date: 20150506
Implantable Lead Implant Date: 20150506
Implantable Lead Location: 753859
Implantable Lead Location: 753860
Implantable Lead Model: 4086
Implantable Lead Model: 4087
Implantable Lead Serial Number: 265257
Implantable Lead Serial Number: 310823
Implantable Pulse Generator Implant Date: 20150506
Lead Channel Impedance Value: 440 Ohm
Lead Channel Impedance Value: 927 Ohm
Lead Channel Pacing Threshold Amplitude: 0.6 V
Lead Channel Pacing Threshold Amplitude: 0.8 V
Lead Channel Pacing Threshold Pulse Width: 0.4 ms
Lead Channel Pacing Threshold Pulse Width: 0.4 ms
Lead Channel Sensing Intrinsic Amplitude: 15.2 mV
Lead Channel Sensing Intrinsic Amplitude: 4.8 mV
Lead Channel Setting Pacing Amplitude: 2 V
Lead Channel Setting Pacing Amplitude: 2.4 V
Lead Channel Setting Pacing Pulse Width: 0.4 ms
Lead Channel Setting Sensing Sensitivity: 2.5 mV
Pulse Gen Serial Number: 101958

## 2021-02-13 LAB — TSH: TSH: 2.74 u[IU]/mL (ref 0.450–4.500)

## 2021-02-13 MED ORDER — AMIODARONE HCL 200 MG PO TABS: 200.0000 mg | ORAL_TABLET | Freq: Every day | ORAL | 3 refills | Status: DC

## 2021-02-13 NOTE — Progress Notes (Signed)
Electrophysiology Office Note Date: 02/13/2021  ID:  Alan [name], DOB 07-12-1931, MRN [id number]  PCP: Nicoletta Dress, MD Primary Cardiologist: None Electrophysiologist: [name], MD   CC: Pacemaker follow-up  Alan [name] is a 88 y.o. male seen today for [name], MD for acute visit due to tachycardia .   Seen in ED in [place] last week with SVT and reports of HRs as high as 200. They were previously given a magnet and used that at home to abort the arrhyhtmia with asynchronous pacing at home.    Since discharge from hospital the patient reports doing well. He reports he hadn't had a significant period of SVT like that in some time. Denies recent illness or med non-compliance. He does report that he did NOT start the amiodarone. The Pharmacist told them It could cause a "heart attack" when used alongside Aricept.   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 Rosita Fire) for symptomatic bradycardia  Past Medical History:  Diagnosis Date   Anemia    Anxiety 07/19/2008   Qualifier: Diagnosis of  By: Hollie Salk CMA, Estill Bamberg     Arthritis    Bradycardia    chronotropic incompetence   Carotid stenosis    <60% 2012   Diabetes mellitus    Borderline   Diverticulosis    Esophageal stricture    Exercise intolerance    Hemorrhoids    Hernia    Hypertension    Lightheadedness 10/14/2010   Peptic ulcer disease    Prostate cancer ([place])    Vitamin B 12 deficiency    Past Surgical History:  Procedure Laterality Date   RETROPUBIC PROSTATECTOMY  2005   Radical   SKIN SURGERY     basal cell carcinoma    Current Outpatient Medications  Medication Sig Dispense Refill   alclomethasone (ACLOVATE) 0.05 % ointment Apply topically as needed.     ALPRAZolam (XANAX) 0.25 MG tablet Take 1 tablet by mouth at bedtime as needed. May take another tablet by mouth if awakens during the night  5   amiodarone (PACERONE) 200 MG tablet Take 1 tablet (200 mg total)  by mouth 2 (two) times daily. 90 tablet 3   aspirin 81 MG tablet Take 81 mg by mouth daily.     b complex vitamins tablet Take 1 tablet by mouth daily.     Cholecalciferol (VITAMIN D PO) Take 2,000 Units by mouth daily.     Cinnamon 500 MG capsule Take 500 mg by mouth daily.     Coenzyme Q10 (CO Q 10) 100 MG CAPS Take 1 capsule by mouth daily.      CRESTOR 20 MG tablet Take 20 mg by mouth daily.   3   Cyanocobalamin (B-12 PO) Take 1 capsule by mouth daily.      donepezil (ARICEPT) 10 MG tablet Take 10 mg by mouth daily.     Garlic 671 MG CAPS Take 600 mg by mouth daily.      MAGNESIUM PO Take 1 tablet by mouth daily.      mirtazapine (REMERON) 15 MG tablet Take 15 mg by mouth every evening.  2   Omega-3 Fatty Acids (OMEGA 3 PO) Take 1 capsule by mouth daily as needed (vitamin supllement).     vitamin C (ASCORBIC ACID) 500 MG tablet Take 500 mg by mouth daily.     Xylometazoline HCl (OTRIVIN NA) Place 2 sprays into both nostrils daily as needed (nasal congestion). Nasal spray as needed  twice daily     No current facility-administered medications for this visit.    Allergies:   Cefdinir, Effexor [venlafaxine], Sulfa antibiotics, Amlodipine besylate, Aliskiren, Sulfamethoxazole, and Tekturna hct [aliskiren-hydrochlorothiazide]   Social History: Social History   Socioeconomic History   Marital status: Married    Spouse name: Not on file   Number of children: 1   Years of education: Not on file   Highest education level: Not on file  Occupational History   Occupation: Retired    Comment: Chief Financial Officer  Tobacco Use   Smoking status: Never   Smokeless tobacco: Never  Scientific laboratory technician Use: Never used  Substance and Sexual Activity   Alcohol use: No   Drug use: No   Sexual activity: Not on file  Other Topics Concern   Not on file  Social History Narrative   Married, 1 son   Right handed   BS    No caffeine   Social Determinants of Health   Financial Resource Strain: Not on  file  Food Insecurity: Not on file  Transportation Needs: Not on file  Physical Activity: Not on file  Stress: Not on file  Social Connections: Not on file  Intimate Partner Violence: Not on file    Family History: Family History  Problem Relation Age of Onset   Heart disease Mother    Stroke Mother    Cancer Father        prostate   Stroke Father    Cancer Paternal Grandfather        prostate     Review of Systems: All other systems reviewed and are otherwise negative except as noted above.  Physical Exam: There were no vitals filed for this visit.   GEN- The patient is well appearing, alert and oriented x 3 today.   HEENT: normocephalic, atraumatic; sclera clear, conjunctiva pink; hearing intact; oropharynx clear; neck supple  Lungs- Clear to ausculation bilaterally, normal work of breathing.  No wheezes, rales, rhonchi Heart- Regular rate and rhythm, no murmurs, rubs or gallops  GI- soft, non-tender, non-distended, bowel sounds present  Extremities- no clubbing or cyanosis. No edema MS- no significant deformity or atrophy Skin- warm and dry, no rash or lesion; PPM pocket well healed Psych- euthymic mood, full affect Neuro- strength and sensation are intact  PPM Interrogation- reviewed in detail today,  See PACEART report  EKG:  EKG is ordered today. Personal review of ekg ordered today shows atrial pacing at 75 bpm   Recent Labs: No results found for requested labs within last 8760 hours.   Wt Readings from Last 3 Encounters:  09/20/20 142 lb 9.6 oz (64.7 kg)  02/28/20 145 lb (65.8 kg)  08/22/19 132 lb (59.9 kg)     Other studies Reviewed: Additional studies/ records that were reviewed today include: Previous EP office notes, Previous remote checks, Most recent labwork.   Assessment and Plan:  1. SND s/p [employer] PPM  Normal PPM function See Claudia Desanctis Art report No changes today 12 months until ERI  2. SVT Recently ordered for amiodarone 200 mg  BID. Never started. In discussion with patients will start 200 mg daily today.  Reviewed potential for bradycardia which is irrelevant in this paced patient. Discussed risk of side effects and toxicities and need for close and careful monitoring.   Labs today.     Current medicines are reviewed at length with the patient today.    Labs/ tests ordered today include:  Orders Placed This  Encounter  Procedures   Comprehensive metabolic panel   TSH   EKG 12-Lead    Disposition:   Follow up with Dr. Caryl Comes in  2 months    Signed, Shirley Friar, PA-C  02/13/2021 8:50 AM  [place] [address] [place]  [place] [address] [phone] (office) [phone] (fax)

## 2021-02-13 NOTE — Patient Instructions (Signed)
Medication Instructions:  Your physician has recommended you make the following change in your medication:   START: Amiodarone 200mg  daily  *If you need a refill on your cardiac medications before your next appointment, please call your pharmacy*   Lab Work: TODAY: CMET, TSH  If you have labs (blood work) drawn today and your tests are completely normal, you will receive your results only by: [place] (if you have MyChart) OR A paper copy in the mail If you have any lab test that is abnormal or we need to change your treatment, we will call you to review the results.   Follow-Up: At [facility], you and your health needs are our priority.  As part of our continuing mission to provide you with exceptional heart care, we have created designated Provider Care Teams.  These Care Teams include your primary Cardiologist (physician) and Advanced Practice Providers (APPs -  Physician Assistants and Nurse Practitioners) who all work together to provide you with the care you need, when you need it.  We recommend signing up for the patient portal called "MyChart".  Sign up information is provided on this After Visit Summary.  MyChart is used to connect with patients for Virtual Visits (Telemedicine).  Patients are able to view lab/test results, encounter notes, upcoming appointments, etc.  Non-urgent messages can be sent to your provider as well.   To learn more about what you can do with MyChart, go to [email or web address].    Your next appointment:   2 month(s)  The format for your next appointment:   In Person  Provider:   [name], MD

## 2021-02-24 ENCOUNTER — Telehealth: Payer: Self-pay

## 2021-02-24 NOTE — Telephone Encounter (Signed)
Alert remote reviewed. Normal device function.   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  Sent to triage per protocol.  There were Three atrial arrhythmias detected and the longest one was 5 seconds Next remote 02/25/2021.  Unsuccessful telephone encounter to patient to follow up on s/s of tachy rhythm/SVT/V-A conduction. Fortunately patient's wife is present and on DPR. Discussed tachy episodes with wife who says patient has not complained. Also states patient DID NOT start amiodarone as prescribed by A. [place], [place] 02/13/21. This is the second time this medication has been prescribed and patient has not filled. Wife states patient remains fearful of medication and will not start it until he sees Dr. Caryl Comes 04/14/21. ED precautions reviewed with wife. Will route to A. [place], PA and Dr. Caryl Comes for review.

## 2021-02-25 ENCOUNTER — Ambulatory Visit (INDEPENDENT_AMBULATORY_CARE_PROVIDER_SITE_OTHER): Payer: Medicare Other

## 2021-02-25 DIAGNOSIS — I495 Sick sinus syndrome: Secondary | ICD-10-CM

## 2021-02-25 LAB — CUP PACEART REMOTE DEVICE CHECK
Battery Remaining Longevity: 11 mo
Battery Remaining Percentage: 17 %
Brady Statistic RA Percent Paced: 86 %
Brady Statistic RV Percent Paced: 0 %
Date Time Interrogation Session: 20230117024500
Implantable Lead Implant Date: 20150506
Implantable Lead Implant Date: 20150506
Implantable Lead Location: 753859
Implantable Lead Location: 753860
Implantable Lead Model: 4086
Implantable Lead Model: 4087
Implantable Lead Serial Number: 265257
Implantable Lead Serial Number: 310823
Implantable Pulse Generator Implant Date: 20150506
Lead Channel Impedance Value: 441 Ohm
Lead Channel Impedance Value: 910 Ohm
Lead Channel Setting Pacing Amplitude: 2 V
Lead Channel Setting Pacing Amplitude: 2.4 V
Lead Channel Setting Pacing Pulse Width: 0.4 ms
Lead Channel Setting Sensing Sensitivity: 2.5 mV
Pulse Gen Serial Number: 101958

## 2021-03-10 NOTE — Progress Notes (Signed)
Remote pacemaker transmission.   

## 2021-04-14 ENCOUNTER — Encounter: Payer: Self-pay | Admitting: Internal Medicine

## 2021-04-14 ENCOUNTER — Other Ambulatory Visit: Payer: Self-pay

## 2021-04-14 ENCOUNTER — Ambulatory Visit (INDEPENDENT_AMBULATORY_CARE_PROVIDER_SITE_OTHER): Payer: Medicare Other | Admitting: Internal Medicine

## 2021-04-14 VITALS — BP 120/66 | HR 70 | Ht 67.0 in | Wt 146.6 lb

## 2021-04-14 DIAGNOSIS — I471 Supraventricular tachycardia: Secondary | ICD-10-CM | POA: Diagnosis not present

## 2021-04-14 DIAGNOSIS — Z950 Presence of cardiac pacemaker: Secondary | ICD-10-CM | POA: Diagnosis not present

## 2021-04-14 DIAGNOSIS — I495 Sick sinus syndrome: Secondary | ICD-10-CM | POA: Diagnosis not present

## 2021-04-14 LAB — CUP PACEART INCLINIC DEVICE CHECK
Date Time Interrogation Session: 20230306133855
Implantable Lead Implant Date: 20150506
Implantable Lead Implant Date: 20150506
Implantable Lead Location: 753859
Implantable Lead Location: 753860
Implantable Lead Model: 4086
Implantable Lead Model: 4087
Implantable Lead Serial Number: 265257
Implantable Lead Serial Number: 310823
Implantable Pulse Generator Implant Date: 20150506
Lead Channel Impedance Value: 456 Ohm
Lead Channel Impedance Value: 967 Ohm
Lead Channel Pacing Threshold Amplitude: 0.7 V
Lead Channel Pacing Threshold Amplitude: 0.7 V
Lead Channel Pacing Threshold Pulse Width: 0.4 ms
Lead Channel Pacing Threshold Pulse Width: 0.4 ms
Lead Channel Sensing Intrinsic Amplitude: 15.9 mV
Lead Channel Sensing Intrinsic Amplitude: 4.8 mV
Lead Channel Setting Pacing Amplitude: 2 V
Lead Channel Setting Pacing Amplitude: 2.4 V
Lead Channel Setting Pacing Pulse Width: 0.4 ms
Lead Channel Setting Sensing Sensitivity: 2.5 mV
Pulse Gen Serial Number: 101958

## 2021-04-14 NOTE — Patient Instructions (Signed)
Medication Instructions:  ?Your physician recommends that you continue on your current medications as directed. Please refer to the Current Medication list given to you today. ? ?*If you need a refill on your cardiac medications before your next appointment, please call your pharmacy* ? ? ?Lab Work: ?None ordered. ? ?If you have labs (blood work) drawn today and your tests are completely normal, you will receive your results only by: ?MyChart Message (if you have MyChart) OR ?A paper copy in the mail ?If you have any lab test that is abnormal or we need to change your treatment, we will call you to review the results. ? ? ?Testing/Procedures: ?None ordered. ? ? ? ?Follow-Up: ?At [facility], you and your health needs are our priority.  As part of our continuing mission to provide you with exceptional heart care, we have created designated Provider Care Teams.  These Care Teams include your primary Cardiologist (physician) and Advanced Practice Providers (APPs -  Physician Assistants and Nurse Practitioners) who all work together to provide you with the care you need, when you need it. ? ?We recommend signing up for the patient portal called "MyChart".  Sign up information is provided on this After Visit Summary.  MyChart is used to connect with patients for Virtual Visits (Telemedicine).  Patients are able to view lab/test results, encounter notes, upcoming appointments, etc.  Non-urgent messages can be sent to your provider as well.   ?To learn more about what you can do with MyChart, go to [email or web address].   ? ?Your next appointment:   ?6 months with Dr Olin Pia PA ?

## 2021-04-14 NOTE — Progress Notes (Signed)
? ? ? ? ?Patient Care Team: ?Nicoletta Dress, MD as PCP - General (Internal Medicine) ?Deboraha Sprang, MD as PCP - Electrophysiology (Cardiology) ? ? ?HPI ? ?Alan [name] is a 88 y.o. male ?Seen in follow-up for sinus bradycardia and chronotropic incompetence. was seen  at [facility]. Multiple discussions find that he received a Chiropractor at [facility] 2014 ? ?Some dementia  ? ?Hospitalized for SVT 1/23  Prescribed amio which he declined ? ?SVT first episode in 4 years  more frightening than anything else  ? ?DATE TEST EF   ?7/18 Echo   53% %   ?     ?  ?  ?Date Cr K Hgb TSH  ?1/23 1.06 4.1   2.74  ?       ? ? ?   ?Past Medical History:  ?Diagnosis Date  ? Anemia   ? Anxiety 07/19/2008  ? Qualifier: Diagnosis of  By: Hollie Salk CMA, Estill Bamberg    ? Arthritis   ? Bradycardia   ? chronotropic incompetence  ? Carotid stenosis   ? <60% 2012  ? Diabetes mellitus   ? Borderline  ? Diverticulosis   ? Esophageal stricture   ? Exercise intolerance   ? Hemorrhoids   ? Hernia   ? Hypertension   ? Lightheadedness 10/14/2010  ? Peptic ulcer disease   ? Prostate cancer ([place])   ? Vitamin B 12 deficiency   ? ? ?Past Surgical History:  ?Procedure Laterality Date  ? RETROPUBIC PROSTATECTOMY  2005  ? Radical  ? SKIN SURGERY    ? basal cell carcinoma  ? ? ?Current Outpatient Medications  ?Medication Sig Dispense Refill  ? alclomethasone (ACLOVATE) 0.05 % ointment Apply topically as needed.    ? ALPRAZolam (XANAX) 0.25 MG tablet Take 1 tablet by mouth at bedtime as needed. May take another tablet by mouth if awakens during the night  5  ? aspirin 81 MG tablet Take 81 mg by mouth daily.    ? b complex vitamins tablet Take 1 tablet by mouth daily.    ? Cholecalciferol (VITAMIN D PO) Take 2,000 Units by mouth daily.    ? Cinnamon 500 MG capsule Take 500 mg by mouth daily.    ? Coenzyme Q10 (CO Q 10) 100 MG CAPS Take 1 capsule by mouth daily.     ? CRESTOR 20 MG tablet Take 20 mg by mouth daily.   3  ? Cyanocobalamin (B-12  PO) Take 1 capsule by mouth daily.     ? donepezil (ARICEPT) 10 MG tablet Take 10 mg by mouth daily.    ? Garlic 884 MG CAPS Take 600 mg by mouth daily.     ? MAGNESIUM PO Take 1 tablet by mouth daily.     ? mirtazapine (REMERON) 30 MG tablet Take 30 mg by mouth at bedtime.    ? Omega-3 Fatty Acids (OMEGA 3 PO) Take 1 capsule by mouth daily as needed (vitamin supllement).    ? sertraline (ZOLOFT) 50 MG tablet Take 50 mg by mouth daily.    ? vitamin C (ASCORBIC ACID) 500 MG tablet Take 500 mg by mouth daily.    ? Xylometazoline HCl (OTRIVIN NA) Place 2 sprays into both nostrils daily as needed (nasal congestion). Nasal spray as needed twice daily    ? amiodarone (PACERONE) 200 MG tablet Take 1 tablet (200 mg total) by mouth daily. (Patient not taking: Reported on 04/14/2021) 90 tablet 3  ? mirtazapine (REMERON)  15 MG tablet Take 15 mg by mouth every evening. (Patient not taking: Reported on 04/14/2021)  2  ? ?No current facility-administered medications for this visit.  ? ? ?Allergies  ?Allergen Reactions  ? Cefdinir Other (See Comments)  ?  confusion  ? Effexor [Venlafaxine] Other (See Comments)  ?  "extremely dizzy"  ? Sulfa Antibiotics Other (See Comments) and Swelling  ? Amlodipine Besylate Swelling  ?  Swelling of scrotum ?Swelling of scrotum  ? Aliskiren Other (See Comments)  ?  Pt doesn't remember  ? Sulfamethoxazole   ?  REACTION: throat swelled  ? Tekturna Hct [Aliskiren-Hydrochlorothiazide]   ?  Lower back pain  ? ? ? ? ?Review of Systems negative except from HPI and PMH ? ?Physical Exam ?BP 120/66   Pulse 70   Ht '5\' 7"'$  (1.702 m)   Wt 146 lb 9.6 oz (66.5 kg)   SpO2 99%   BMI 22.96 kg/m?  ?Well developed and well nourished in no acute distress ?HENT normal ?Neck supple with JVP-flat ?Clear ?Device pocket well healed; without hematoma or erythema.  There is no tethering  ?Regular rate and rhythm, no  murmur ?Abd-soft with active BS ?No Clubbing cyanosis  edema ?Skin-warm and dry ?A & Oriented  Grossly normal  sensory and motor function ? ?ECG Apacing @ 70 ?20/08/35  ?  ?Assessment and  Plan ? ?Sinus node dysfunction ? ?Pacemaker-[employer] prob SVT ? ?Depression  ? ?Supraventricular tachycardia   ? ?Ended up in hospital for SVT, but this was first episode in 4 years hence would take a strategy of intermittent therapy  will give verapamil 40 to take prn  and if necessary call EMS ?If frequency changes can alter plan ? ?  ? ? ?  ?

## 2021-05-21 ENCOUNTER — Telehealth: Payer: Self-pay

## 2021-05-21 NOTE — Telephone Encounter (Signed)
Transmission received. Now back in SR.  ? ? ? ? ? ? ? ?

## 2021-05-21 NOTE — Telephone Encounter (Signed)
BSC alert VT Episode occurred (V>A).  Episode currently in progress from 4/12 @ 01:47, HR ~170. EGM's show short V to A, regular tachy. ? ?Spoke to wife and reports she is not at home with patient but he woke up with racing heart rate, rate slowed down to 70's abut 4:00AM. States he was doing fine this morning when she left and compliant with medications on file. Requested patient send updated transmission so we can look at his presenting rhythm. Voiced understanding. ? ? ?

## 2021-05-27 ENCOUNTER — Ambulatory Visit (INDEPENDENT_AMBULATORY_CARE_PROVIDER_SITE_OTHER): Payer: Medicare Other

## 2021-05-27 DIAGNOSIS — I495 Sick sinus syndrome: Secondary | ICD-10-CM | POA: Diagnosis not present

## 2021-05-27 LAB — CUP PACEART REMOTE DEVICE CHECK
Battery Remaining Longevity: 9 mo
Battery Remaining Percentage: 14 %
Brady Statistic RA Percent Paced: 73 %
Brady Statistic RV Percent Paced: 0 %
Date Time Interrogation Session: 20230418012800
Implantable Lead Implant Date: 20150506
Implantable Lead Implant Date: 20150506
Implantable Lead Location: 753859
Implantable Lead Location: 753860
Implantable Lead Model: 4086
Implantable Lead Model: 4087
Implantable Lead Serial Number: 265257
Implantable Lead Serial Number: 310823
Implantable Pulse Generator Implant Date: 20150506
Lead Channel Impedance Value: 442 Ohm
Lead Channel Impedance Value: 936 Ohm
Lead Channel Setting Pacing Amplitude: 2 V
Lead Channel Setting Pacing Amplitude: 2.4 V
Lead Channel Setting Pacing Pulse Width: 0.4 ms
Lead Channel Setting Sensing Sensitivity: 2.5 mV
Pulse Gen Serial Number: 101958

## 2021-06-13 NOTE — Progress Notes (Signed)
Remote pacemaker transmission.   

## 2021-07-01 ENCOUNTER — Telehealth: Payer: Self-pay

## 2021-07-01 NOTE — Telephone Encounter (Signed)
Spoke with patient and made her aware of the monthly checks until a change out is needed

## 2021-07-01 NOTE — Telephone Encounter (Signed)
BSC alert - VT Episode occurred (V>A). EGM's show short V to A, longest duration 17mn, HR 164 3 brief ATR's, PAT Battery estimated 5107monext remote 7/18 Route to triage for battery LA  Unsuccessful telephone encounter to patient to discuss need for monthly battery checks. Hipaa compliant VM message left requesting call back to [phone] remotes to begin 07/25/21 and scheduled in epic and [employer].

## 2021-07-25 ENCOUNTER — Ambulatory Visit (INDEPENDENT_AMBULATORY_CARE_PROVIDER_SITE_OTHER): Payer: Medicare Other

## 2021-07-25 DIAGNOSIS — I495 Sick sinus syndrome: Secondary | ICD-10-CM

## 2021-07-26 LAB — CUP PACEART REMOTE DEVICE CHECK
Battery Remaining Longevity: 7 mo
Battery Remaining Percentage: 12 %
Brady Statistic RA Percent Paced: 75 %
Brady Statistic RV Percent Paced: 0 %
Date Time Interrogation Session: 20230616032400
Implantable Lead Implant Date: 20150506
Implantable Lead Implant Date: 20150506
Implantable Lead Location: 753859
Implantable Lead Location: 753860
Implantable Lead Model: 4086
Implantable Lead Model: 4087
Implantable Lead Serial Number: 265257
Implantable Lead Serial Number: 310823
Implantable Pulse Generator Implant Date: 20150506
Lead Channel Impedance Value: 465 Ohm
Lead Channel Impedance Value: 969 Ohm
Lead Channel Setting Pacing Amplitude: 2 V
Lead Channel Setting Pacing Amplitude: 2.4 V
Lead Channel Setting Pacing Pulse Width: 0.4 ms
Lead Channel Setting Sensing Sensitivity: 2.5 mV
Pulse Gen Serial Number: 101958

## 2021-07-31 NOTE — Addendum Note (Signed)
Addended by: Cheri Kearns A on: 07/31/2021 01:58 PM   Modules accepted: Level of Service

## 2021-07-31 NOTE — Progress Notes (Signed)
Remote pacemaker transmission.   

## 2021-08-25 ENCOUNTER — Ambulatory Visit (INDEPENDENT_AMBULATORY_CARE_PROVIDER_SITE_OTHER): Payer: Medicare Other

## 2021-08-25 DIAGNOSIS — I495 Sick sinus syndrome: Secondary | ICD-10-CM | POA: Diagnosis not present

## 2021-08-26 LAB — CUP PACEART REMOTE DEVICE CHECK
Battery Remaining Longevity: 7 mo
Battery Remaining Percentage: 11 %
Brady Statistic RA Percent Paced: 76 %
Brady Statistic RV Percent Paced: 0 %
Date Time Interrogation Session: 20230718011400
Implantable Lead Implant Date: 20150506
Implantable Lead Implant Date: 20150506
Implantable Lead Location: 753859
Implantable Lead Location: 753860
Implantable Lead Model: 4086
Implantable Lead Model: 4087
Implantable Lead Serial Number: 265257
Implantable Lead Serial Number: 310823
Implantable Pulse Generator Implant Date: 20150506
Lead Channel Impedance Value: 452 Ohm
Lead Channel Impedance Value: 970 Ohm
Lead Channel Setting Pacing Amplitude: 2 V
Lead Channel Setting Pacing Amplitude: 2.4 V
Lead Channel Setting Pacing Pulse Width: 0.4 ms
Lead Channel Setting Sensing Sensitivity: 2.5 mV
Pulse Gen Serial Number: 101958

## 2021-09-04 ENCOUNTER — Ambulatory Visit (INDEPENDENT_AMBULATORY_CARE_PROVIDER_SITE_OTHER): Payer: Medicare Other | Admitting: Physician Assistant

## 2021-09-04 ENCOUNTER — Encounter: Payer: Self-pay | Admitting: Physician Assistant

## 2021-09-04 VITALS — BP 130/78 | HR 70 | Ht 67.0 in | Wt 150.8 lb

## 2021-09-04 DIAGNOSIS — Z950 Presence of cardiac pacemaker: Secondary | ICD-10-CM

## 2021-09-04 DIAGNOSIS — I471 Supraventricular tachycardia: Secondary | ICD-10-CM | POA: Diagnosis not present

## 2021-09-04 DIAGNOSIS — I10 Essential (primary) hypertension: Secondary | ICD-10-CM

## 2021-09-04 LAB — CUP PACEART INCLINIC DEVICE CHECK
Date Time Interrogation Session: 20230727190617
Implantable Lead Implant Date: 20150506
Implantable Lead Implant Date: 20150506
Implantable Lead Location: 753859
Implantable Lead Location: 753860
Implantable Lead Model: 4086
Implantable Lead Model: 4087
Implantable Lead Serial Number: 265257
Implantable Lead Serial Number: 310823
Implantable Pulse Generator Implant Date: 20150506
Lead Channel Pacing Threshold Amplitude: 0.7 V
Lead Channel Pacing Threshold Amplitude: 0.7 V
Lead Channel Pacing Threshold Pulse Width: 0.04 ms
Lead Channel Pacing Threshold Pulse Width: 0.4 ms
Lead Channel Sensing Intrinsic Amplitude: 15.7 mV
Lead Channel Sensing Intrinsic Amplitude: 5 mV
Pulse Gen Serial Number: 101958

## 2021-09-04 MED ORDER — VERAPAMIL HCL ER 120 MG PO TBCR: 120.0000 mg | EXTENDED_RELEASE_TABLET | Freq: Every day | ORAL | 3 refills | Status: DC

## 2021-09-04 NOTE — Progress Notes (Signed)
Cardiology Office Note Date:  09/04/2021  Patient ID:  Alan [name], Alan [name] 1931/03/30, MRN [id number] PCP:  Nicoletta Dress, MD  Electrophysiologist:  Dr. Caryl Comes    Chief Complaint: recurrent tachycardias  History of Present Illness: Alan [name] is a 88 y.o. male with history of prostate  (treated surgically), HTN, anxiety/depression, dementia, symptomatic bradycardia w/PPM.  He comes today to be seen for Dr. Caryl Comes, he last saw him 04/14/21, discussed an SVT the 1st in years, anxiety provoking, asymptomatic otherwise.  Planned for PRN verapamil '40mg'$  given infrequency of his SVT   Device clinic with some notes regarding labled VT episodes, discussed short V-A intervals  TODAY He comes today with his wife He is slowing, particularly since COVID shut down, never really got his energy/strngth back under him from being cooped up in home. He drifts to sleep today intermittently. He is very sedentary They both report though otherwise doing well, no symptoms of his tachycardia, no noted racing heart. No CP, no SOB No near syncope or syncope. His wife reports that they are here to keep an eye on his device battery, the patient reports no symptoms and otherwise would not have been coming in.  He never did start the amiodarone They both have strong reservations about the medication, warned by his pharmacists of sever bradycardia with his Aricept and could cause a heart attack. Despite prior conversations that given his pacer bradycardia not a concern, they remain with reservations. They both have reservations about starting any new medicines.  Device information: BSci dual chamber PPM, implanted 06/14/13 (Dr. Rosita Fire)   Past Medical History:  Diagnosis Date   Anemia    Anxiety 07/19/2008   Qualifier: Diagnosis of  By: Hollie Salk CMA, Estill Bamberg     Arthritis    Bradycardia    chronotropic incompetence   Carotid stenosis    <60% 2012   Diabetes mellitus    Borderline   Diverticulosis     Esophageal stricture    Exercise intolerance    Hemorrhoids    Hernia    Hypertension    Lightheadedness 10/14/2010   Peptic ulcer disease    Prostate cancer ([place])    Vitamin B 12 deficiency     Past Surgical History:  Procedure Laterality Date   RETROPUBIC PROSTATECTOMY  2005   Radical   SKIN SURGERY     basal cell carcinoma    Current Outpatient Medications  Medication Sig Dispense Refill   alclomethasone (ACLOVATE) 0.05 % ointment Apply topically as needed.     ALPRAZolam (XANAX) 0.25 MG tablet Take 1 tablet by mouth at bedtime as needed. May take another tablet by mouth if awakens during the night  5   amiodarone (PACERONE) 200 MG tablet Take 1 tablet (200 mg total) by mouth daily. (Patient not taking: Reported on 04/14/2021) 90 tablet 3   aspirin 81 MG tablet Take 81 mg by mouth daily.     b complex vitamins tablet Take 1 tablet by mouth daily.     Cholecalciferol (VITAMIN D PO) Take 2,000 Units by mouth daily.     Cinnamon 500 MG capsule Take 500 mg by mouth daily.     Coenzyme Q10 (CO Q 10) 100 MG CAPS Take 1 capsule by mouth daily.      CRESTOR 20 MG tablet Take 20 mg by mouth daily.   3   Cyanocobalamin (B-12 PO) Take 1 capsule by mouth daily.      donepezil (ARICEPT) 10 MG tablet Take  10 mg by mouth daily.     Garlic 481 MG CAPS Take 600 mg by mouth daily.      MAGNESIUM PO Take 1 tablet by mouth daily.      mirtazapine (REMERON) 15 MG tablet Take 15 mg by mouth every evening. (Patient not taking: Reported on 04/14/2021)  2   mirtazapine (REMERON) 30 MG tablet Take 30 mg by mouth at bedtime.     Omega-3 Fatty Acids (OMEGA 3 PO) Take 1 capsule by mouth daily as needed (vitamin supllement).     sertraline (ZOLOFT) 50 MG tablet Take 50 mg by mouth daily.     vitamin C (ASCORBIC ACID) 500 MG tablet Take 500 mg by mouth daily.     Xylometazoline HCl (OTRIVIN NA) Place 2 sprays into both nostrils daily as needed (nasal congestion). Nasal spray as needed twice daily     No  current facility-administered medications for this visit.    Allergies:   Cefdinir, Effexor [venlafaxine], Sulfa antibiotics, Amlodipine besylate, Aliskiren, Sulfamethoxazole, and Tekturna hct [aliskiren-hydrochlorothiazide]   Social History:  The patient  reports that he has never smoked. He has never used smokeless tobacco. He reports that he does not drink alcohol and does not use drugs.   Family History:  The patient's family history includes Cancer in his father and paternal grandfather; Heart disease in his mother; Stroke in his father and mother.  ROS:  Please see the history of present illness. All other systems are reviewed and otherwise negative.   PHYSICAL EXAM:  VS:  There were no vitals taken for this visit. BMI: There is no height or weight on file to calculate BMI. Well nourished, well developed, in no acute distress  HEENT: normocephalic, atraumatic  Neck: no JVD, carotid bruits or masses Cardiac:  RRR; no significant murmurs, no rubs, or gallops Lungs:  CTA b/l, no wheezing, rhonchi or rales  Abd: soft, nontender MS: no deformity, age appropriate/advanced atrophy Ext: trace edema  Skin: warm and dry, no rash Neuro:  No gross deficits appreciated Psych: euthymic mood, full affect  PPM site is stable, no tethering or discomfort   EKG:  not done today   PPM interrogation done today and reviewed by myself:  Battery estimate is 45moLead measurements are good Multiple SVT episodes Morphology is all SVT Some are clearly A>V, others more simultaneous and likely an AVNRT None do I think are VT   07/14/12: Stress myoview Impression Exercise Capacity:  Lexiscan with low level exercise. BP Response:  Normal blood pressure response. Clinical Symptoms:  The exercise was limited by fatigue. Walked 9:20 but unable to get heart rate above 103 so he was switched to [employer]. ECG Impression:  No significant ST segment change suggestive of ischemia. Comparison with Prior  Nuclear Study: No images to compare Overall Impression:  Low risk stress nuclear study . No ischemia. Patient demonstrates chronotropic incompetence..   LV Ejection Fraction: 62%.  LV Wall Motion:  NL LV Function; NL Wall Motion   08/30/07: TTE  SUMMARY  -  Overall left ventricular systolic function was normal. There were        no left ventricular regional wall motion abnormalities.        Doppler parameters were consistent with abnormal left        ventricular relaxation.  -  Left atrial size was at the upper limits of normal.  Recent Labs: 02/13/2021: ALT 15; BUN 16; Creatinine, Ser 1.06; Potassium 4.1; Sodium 143; TSH 2.740  No  results found for requested labs within last 365 days.   CrCl cannot be calculated (Patient's most recent lab result is older than the maximum 21 days allowed.).   Wt Readings from Last 3 Encounters:  04/14/21 146 lb 9.6 oz (66.5 kg)  02/13/21 148 lb (67.1 kg)  09/20/20 142 lb 9.6 oz (64.7 kg)     Other studies reviewed: Additional studies/records reviewed today include: summarized above  ASSESSMENT AND PLAN:  1. PPM     Intact function     Nearing ERI  2. SVT     Several episodes, mostly short lasting seconds, one as long as 29 minutes      They reoprt the PRN verapamil did not get sent in.     Regardless, he has not felt any of the tachycardia and a PRN med may not be helpful, I recommended a daily verapamil They are agreeable to try it.  Start verapamil '120mg'$  daily  3. HTN     Looks OK, should tolerate daily verapamil   Disposition: we can have him back in a month or so, sooner if needed  Current medicines are reviewed at length with the patient today.  The patient did not have any concerns regarding medicines.  Venetia Night, PA-C 09/04/2021 5:10 AM     [facility] [address] [place] [address] [phone] (office)  [phone] (fax)

## 2021-09-04 NOTE — Patient Instructions (Signed)
Medication Instructions:  Your physician has recommended you make the following change in your medication:   START: Verapamil '120mg'$  daily  *If you need a refill on your cardiac medications before your next appointment, please call your pharmacy*   Lab Work: None If you have labs (blood work) drawn today and your tests are completely normal, you will receive your results only by: [place] (if you have MyChart) OR A paper copy in the mail If you have any lab test that is abnormal or we need to change your treatment, we will call you to review the results.   Follow-Up: At [facility], you and your health needs are our priority.  As part of our continuing mission to provide you with exceptional heart care, we have created designated Provider Care Teams.  These Care Teams include your primary Cardiologist (physician) and Advanced Practice Providers (APPs -  Physician Assistants and Nurse Practitioners) who all work together to provide you with the care you need, when you need it.  We recommend signing up for the patient portal called "MyChart".  Sign up information is provided on this After Visit Summary.  MyChart is used to connect with patients for Virtual Visits (Telemedicine).  Patients are able to view lab/test results, encounter notes, upcoming appointments, etc.  Non-urgent messages can be sent to your provider as well.   To learn more about what you can do with MyChart, go to [email or web address].    Your next appointment:   10/07/2021

## 2021-09-25 ENCOUNTER — Ambulatory Visit (INDEPENDENT_AMBULATORY_CARE_PROVIDER_SITE_OTHER): Payer: Medicare Other

## 2021-09-25 DIAGNOSIS — I495 Sick sinus syndrome: Secondary | ICD-10-CM

## 2021-09-29 NOTE — Progress Notes (Signed)
Cardiology Office Note Date:  10/07/2021  Patient ID:  Alan [name], Alan [name] 16-Oct-1931, MRN [id number] PCP:  Nicoletta Dress, MD  Electrophysiologist:  Dr. Caryl Comes    Chief Complaint: recurrent tachycardias  History of Present Illness: Alan [name] is a 88 y.o. male with history of prostate  (treated surgically), HTN, anxiety/depression, dementia, symptomatic bradycardia w/PPM.  Seen 7/27 by Joseph Art for Dr. Caryl Comes, he last saw him 04/14/21, discussed an SVT the 1st in years, anxiety provoking, asymptomatic otherwise.  Planned for PRN verapamil '40mg'$  given infrequency of his SVT   Never started amiodarone due to reservations about side effects. Started on LA verapamil at that visit.  Since then he has had swelling in his ankles. Had similar swelling in the past on amlodipine.   Overall, he is feeling ok otherwise with no specific complaints. He has not felt heart racing. Not very active. Rare lightheadedness, especially with rapid position changing. No chest pain or syncope. They are still hesitant to consider amiodarone, though at this point, do not think his rare SVT warrants.   Device information: BSci dual chamber PPM, implanted 06/14/13 (Dr. Rosita Fire)   Past Medical History:  Diagnosis Date   Anemia    Anxiety 07/19/2008   Qualifier: Diagnosis of  By: Hollie Salk CMA, Estill Bamberg     Arthritis    Bradycardia    chronotropic incompetence   Carotid stenosis    <60% 2012   Diabetes mellitus    Borderline   Diverticulosis    Esophageal stricture    Exercise intolerance    Hemorrhoids    Hernia    Hypertension    Lightheadedness 10/14/2010   Peptic ulcer disease    Prostate cancer ([place])    Vitamin B 12 deficiency     Past Surgical History:  Procedure Laterality Date   RETROPUBIC PROSTATECTOMY  2005   Radical   SKIN SURGERY     basal cell carcinoma    Current Outpatient Medications  Medication Sig Dispense Refill   alclomethasone (ACLOVATE) 0.05 % ointment Apply topically as needed.      ALPRAZolam (XANAX) 0.25 MG tablet Take 1 tablet by mouth at bedtime as needed. May take another tablet by mouth if awakens during the night  5   aspirin 81 MG tablet Take 81 mg by mouth daily.     b complex vitamins tablet Take 1 tablet by mouth daily.     Cholecalciferol (VITAMIN D PO) Take 2,000 Units by mouth daily.     Cinnamon 500 MG capsule Take 500 mg by mouth daily.     Coenzyme Q10 (CO Q 10) 100 MG CAPS Take 1 capsule by mouth daily.      CRESTOR 20 MG tablet Take 20 mg by mouth daily.   3   Cyanocobalamin (B-12 PO) Take 1 capsule by mouth daily.      donepezil (ARICEPT) 10 MG tablet Take 10 mg by mouth daily.     Garlic 294 MG CAPS Take 600 mg by mouth daily.      MAGNESIUM PO Take 1 tablet by mouth daily.      mirtazapine (REMERON) 30 MG tablet Take 30 mg by mouth at bedtime.     Omega-3 Fatty Acids (OMEGA 3 PO) Take 1 capsule by mouth daily as needed (vitamin supllement).     triamcinolone ointment (KENALOG) 0.1 % Apply 1 Application topically 2 (two) times daily.     verapamil (CALAN-SR) 120 MG CR tablet Take 1 tablet (120 mg total)  by mouth daily. 90 tablet 3   vitamin C (ASCORBIC ACID) 500 MG tablet Take 500 mg by mouth daily.     Xylometazoline HCl (OTRIVIN NA) Place 2 sprays into both nostrils daily as needed (nasal congestion). Nasal spray as needed twice daily     No current facility-administered medications for this visit.    Allergies:   Cefdinir, Effexor [venlafaxine], Sulfa antibiotics, Amlodipine besylate, Aliskiren, Sulfamethoxazole, and Tekturna hct [aliskiren-hydrochlorothiazide]   Social History:  The patient  reports that he has never smoked. He has never used smokeless tobacco. He reports that he does not drink alcohol and does not use drugs.   Family History:  The patient's family history includes Cancer in his father and paternal grandfather; Heart disease in his mother; Stroke in his father and mother.  ROS:  Please see the history of present  illness. All other systems are reviewed and otherwise negative.   PHYSICAL EXAM:  VS:  BP 128/76   Pulse 78   Ht '5\' 7"'$  (1.702 m)   Wt 156 lb 12.8 oz (71.1 kg)   SpO2 95%   BMI 24.56 kg/m  BMI: Body mass index is 24.56 kg/m. Well nourished, well developed, in no acute distress  HEENT: normocephalic, atraumatic  Neck: no JVD, carotid bruits or masses Cardiac:  RRR; no significant murmurs, no rubs, or gallops Lungs:  CTA b/l, no wheezing, rhonchi or rales  Abd: soft, nontender MS: no deformity, age appropriate/advanced atrophy Ext: trace edema  Skin: warm and dry, no rash Neuro:  No gross deficits appreciated Psych: euthymic mood, full affect  PPM site is stable, no tethering or discomfort   EKG:  not done today   PPM interrogation not checked fully today. Brief check shows occasional SVT as prior. Testing not ran given proximity to ERI and recent stable check.    07/14/12: Stress myoview Impression Exercise Capacity:  Lexiscan with low level exercise. BP Response:  Normal blood pressure response. Clinical Symptoms:  The exercise was limited by fatigue. Walked 9:20 but unable to get heart rate above 103 so he was switched to [employer].. ECG Impression:  No significant ST segment change suggestive of ischemia. Comparison with Prior Nuclear Study: No images to compare Overall Impression:  Low risk stress nuclear study . No ischemia. Patient demonstrates chronotropic incompetence..   LV Ejection Fraction: 62%.  LV Wall Motion:  NL LV Function; NL Wall Motion   08/30/07: TTE  SUMMARY  -  Overall left ventricular systolic function was normal. There were        no left ventricular regional wall motion abnormalities.        Doppler parameters were consistent with abnormal left        ventricular relaxation.  -  Left atrial size was at the upper limits of normal.  Recent Labs: 02/13/2021: ALT 15; BUN 16; Creatinine, Ser 1.06; Potassium 4.1; Sodium 143; TSH 2.740  No results  found for requested labs within last 365 days.   CrCl cannot be calculated (Patient's most recent lab result is older than the maximum 21 days allowed.).   Wt Readings from Last 3 Encounters:  10/07/21 156 lb 12.8 oz (71.1 kg)  09/04/21 150 lb 12.8 oz (68.4 kg)  04/14/21 146 lb 9.6 oz (66.5 kg)     Other studies reviewed: Additional studies/records reviewed today include: summarized above  ASSESSMENT AND PLAN:  1. SND s/p [employer] PPM  Normal PPM function today. See Claudia Desanctis Art report from last visit.  No  changes today 4 months until ERI  2. SVT Swelling on verapamil Declines amiodarone given side effect profile.  Start toprol 25 mg daily.   3. HTN Stable on current regimen   Disposition: 4 months with Dr. Rea College   Current medicines are reviewed at length with the patient today.   Signed, Lollie Marrow, PA-C  10/07/2021 12:15 PM   [address] [place] [address] [phone] (office)  [phone] (fax)

## 2021-09-30 ENCOUNTER — Other Ambulatory Visit: Payer: Self-pay

## 2021-09-30 LAB — CUP PACEART REMOTE DEVICE CHECK
Battery Remaining Longevity: 5 mo
Battery Remaining Percentage: 7 %
Brady Statistic RA Percent Paced: 74 %
Brady Statistic RV Percent Paced: 0 %
Date Time Interrogation Session: 20230822011500
Implantable Lead Implant Date: 20150506
Implantable Lead Implant Date: 20150506
Implantable Lead Location: 753859
Implantable Lead Location: 753860
Implantable Lead Model: 4086
Implantable Lead Model: 4087
Implantable Lead Serial Number: 265257
Implantable Lead Serial Number: 310823
Implantable Pulse Generator Implant Date: 20150506
Lead Channel Impedance Value: 443 Ohm
Lead Channel Impedance Value: 970 Ohm
Lead Channel Setting Pacing Amplitude: 2 V
Lead Channel Setting Pacing Amplitude: 2.4 V
Lead Channel Setting Pacing Pulse Width: 0.4 ms
Lead Channel Setting Sensing Sensitivity: 2.5 mV
Pulse Gen Serial Number: 101958

## 2021-09-30 NOTE — Progress Notes (Signed)
Remote pacemaker transmission.   

## 2021-09-30 NOTE — Patient Outreach (Signed)
  Care Coordination   Initial Visit Note   09/30/2021 Name: DRESHAUN STENE MRN: [id number] DOB: Nov 28, 1931  Merlinda Frederick is a 88 y.o. year old male who sees Nicoletta Dress, MD for primary care. I spoke with  Merlinda Frederick by phone today  What matters to the patients health and wellness today?  Incoming call from patient and wife. Patient reports that he is self managing well. Denies any current needs. Wife mentions that patient has dementia and she would call me in the future for new needs. Provided my contact information.      SDOH assessments and interventions completed:  No     Care Coordination Interventions Activated:  No  Care Coordination Interventions:  No, not indicated   Follow up plan: No further intervention required.   Encounter Outcome:  Pt. Refused   Tomasa Rand, RN, BSN, CEN [facility] [employer] [phone]

## 2021-09-30 NOTE — Patient Outreach (Signed)
  Care Coordination   09/30/2021 Name: Alan [name] MRN: [id number] DOB: 09-03-1931   Care Coordination Outreach Attempts:  An unsuccessful telephone outreach was attempted today to offer the patient information about available care coordination services as a benefit of their health plan.  Placed call to patient who was not available. Wife took my phone and will have patient call me back  Follow Up Plan:  Additional outreach attempts will be made to offer the patient care coordination information and services.   Encounter Outcome:  No Answer  Care Coordination Interventions Activated:  No   Care Coordination Interventions:  No, not indicated    Tomasa Rand, RN, BSN, CEN [facility] [employer] [phone]

## 2021-10-07 ENCOUNTER — Ambulatory Visit: Payer: Medicare Other | Attending: Student | Admitting: Student

## 2021-10-07 ENCOUNTER — Encounter: Payer: Self-pay | Admitting: Student

## 2021-10-07 VITALS — BP 128/76 | HR 78 | Ht 67.0 in | Wt 156.8 lb

## 2021-10-07 DIAGNOSIS — I471 Supraventricular tachycardia: Secondary | ICD-10-CM | POA: Insufficient documentation

## 2021-10-07 DIAGNOSIS — I10 Essential (primary) hypertension: Secondary | ICD-10-CM | POA: Insufficient documentation

## 2021-10-07 DIAGNOSIS — Z950 Presence of cardiac pacemaker: Secondary | ICD-10-CM | POA: Diagnosis present

## 2021-10-07 MED ORDER — METOPROLOL SUCCINATE ER 25 MG PO TB24: 12.5000 mg | ORAL_TABLET | Freq: Every day | ORAL | 3 refills | Status: DC

## 2021-10-07 NOTE — Patient Instructions (Signed)
Medication Instructions:  Your physician has recommended you make the following change in your medication:   DISCONTINUE: Verapamil START: Metoprolol Succinate 12.'5mg'$  daily at bedtime  *If you need a refill on your cardiac medications before your next appointment, please call your pharmacy*   Lab Work: None  If you have labs (blood work) drawn today and your tests are completely normal, you will receive your results only by: [place] (if you have MyChart) OR A paper copy in the mail If you have any lab test that is abnormal or we need to change your treatment, we will call you to review the results.   Follow-Up: At [facility], you and your health needs are our priority.  As part of our continuing mission to provide you with exceptional heart care, we have created designated Provider Care Teams.  These Care Teams include your primary Cardiologist (physician) and Advanced Practice Providers (APPs -  Physician Assistants and Nurse Practitioners) who all work together to provide you with the care you need, when you need it.  We recommend signing up for the patient portal called "MyChart".  Sign up information is provided on this After Visit Summary.  MyChart is used to connect with patients for Virtual Visits (Telemedicine).  Patients are able to view lab/test results, encounter notes, upcoming appointments, etc.  Non-urgent messages can be sent to your provider as well.   To learn more about what you can do with MyChart, go to [email or web address].    Your next appointment:   02/06/2022

## 2021-10-14 NOTE — Progress Notes (Signed)
Remote pacemaker transmission.   

## 2021-10-14 NOTE — Addendum Note (Signed)
Addended by: Douglass Rivers D on: 10/14/2021 03:21 PM   Modules accepted: Level of Service

## 2021-10-27 ENCOUNTER — Ambulatory Visit (INDEPENDENT_AMBULATORY_CARE_PROVIDER_SITE_OTHER): Payer: Medicare Other

## 2021-10-27 DIAGNOSIS — I495 Sick sinus syndrome: Secondary | ICD-10-CM

## 2021-10-28 LAB — CUP PACEART REMOTE DEVICE CHECK
Battery Remaining Longevity: 5 mo
Battery Remaining Percentage: 7 %
Brady Statistic RA Percent Paced: 80 %
Brady Statistic RV Percent Paced: 0 %
Date Time Interrogation Session: 20230918014000
Implantable Lead Implant Date: 20150506
Implantable Lead Implant Date: 20150506
Implantable Lead Location: 753859
Implantable Lead Location: 753860
Implantable Lead Model: 4086
Implantable Lead Model: 4087
Implantable Lead Serial Number: 265257
Implantable Lead Serial Number: 310823
Implantable Pulse Generator Implant Date: 20150506
Lead Channel Impedance Value: 460 Ohm
Lead Channel Impedance Value: 982 Ohm
Lead Channel Setting Pacing Amplitude: 2 V
Lead Channel Setting Pacing Amplitude: 2.4 V
Lead Channel Setting Pacing Pulse Width: 0.4 ms
Lead Channel Setting Sensing Sensitivity: 2.5 mV
Pulse Gen Serial Number: 101958

## 2021-10-30 ENCOUNTER — Telehealth: Payer: Self-pay

## 2021-10-30 NOTE — Telephone Encounter (Signed)
Patient has a allergy to sulfa medications on his allergy list.  Do not know the severity but it is possible furosemide could exacerbate.

## 2021-10-30 NOTE — Telephone Encounter (Signed)
Patient wife called in letting us know his pcp put him on lasix for swelling in his legs and she wants to make sure this is okay for him to take it before he starts.

## 2021-10-30 NOTE — Telephone Encounter (Signed)
Spoke with pt's wife, DPR and advised per pharmacist because of pt's sulfa allergy pt could potentially have a reaction to Furosemide and to watch for S/Sx of swelling of lips, throat and tongue.  Pt has been prescribed Furosemide '20mg'$   - 1 tablet by mouth daily and has a follow up appointment with PCP next Friday.  Advised for further questions or concerns please contact prescribing provider.  Pt's wife verbalizes understanding and agrees with current plan.

## 2021-11-07 NOTE — Addendum Note (Signed)
Addended by: Cheri Kearns A on: 11/07/2021 02:42 PM   Modules accepted: Level of Service

## 2021-11-07 NOTE — Progress Notes (Signed)
Remote pacemaker transmission.   

## 2021-11-27 ENCOUNTER — Ambulatory Visit (INDEPENDENT_AMBULATORY_CARE_PROVIDER_SITE_OTHER): Payer: Medicare Other

## 2021-11-27 DIAGNOSIS — I495 Sick sinus syndrome: Secondary | ICD-10-CM | POA: Diagnosis not present

## 2021-11-27 LAB — CUP PACEART REMOTE DEVICE CHECK
Battery Remaining Longevity: 5 mo
Battery Remaining Percentage: 7 %
Brady Statistic RA Percent Paced: 81 %
Brady Statistic RV Percent Paced: 0 %
Date Time Interrogation Session: 20231019052100
Implantable Lead Implant Date: 20150506
Implantable Lead Implant Date: 20150506
Implantable Lead Location: 753859
Implantable Lead Location: 753860
Implantable Lead Model: 4086
Implantable Lead Model: 4087
Implantable Lead Serial Number: 265257
Implantable Lead Serial Number: 310823
Implantable Pulse Generator Implant Date: 20150506
Lead Channel Impedance Value: 1013 Ohm
Lead Channel Impedance Value: 458 Ohm
Lead Channel Setting Pacing Amplitude: 2 V
Lead Channel Setting Pacing Amplitude: 2.4 V
Lead Channel Setting Pacing Pulse Width: 0.4 ms
Lead Channel Setting Sensing Sensitivity: 2.5 mV
Pulse Gen Serial Number: 101958

## 2021-12-05 NOTE — Progress Notes (Signed)
Remote pacemaker transmission.   

## 2021-12-11 ENCOUNTER — Encounter (INDEPENDENT_AMBULATORY_CARE_PROVIDER_SITE_OTHER): Payer: Medicare Other | Admitting: Ophthalmology

## 2021-12-29 ENCOUNTER — Ambulatory Visit (INDEPENDENT_AMBULATORY_CARE_PROVIDER_SITE_OTHER): Payer: Medicare Other

## 2021-12-29 DIAGNOSIS — I495 Sick sinus syndrome: Secondary | ICD-10-CM

## 2021-12-29 LAB — CUP PACEART REMOTE DEVICE CHECK
Battery Remaining Longevity: 3 mo — CL
Battery Remaining Percentage: 3 %
Brady Statistic RA Percent Paced: 82 %
Brady Statistic RV Percent Paced: 0 %
Date Time Interrogation Session: 20231120033700
Implantable Lead Connection Status: 753985
Implantable Lead Connection Status: 753985
Implantable Lead Implant Date: 20150506
Implantable Lead Implant Date: 20150506
Implantable Lead Location: 753859
Implantable Lead Location: 753860
Implantable Lead Model: 4086
Implantable Lead Model: 4087
Implantable Lead Serial Number: 265257
Implantable Lead Serial Number: 310823
Implantable Pulse Generator Implant Date: 20150506
Lead Channel Impedance Value: 461 Ohm
Lead Channel Impedance Value: 994 Ohm
Lead Channel Setting Pacing Amplitude: 2 V
Lead Channel Setting Pacing Amplitude: 2.4 V
Lead Channel Setting Pacing Pulse Width: 0.4 ms
Lead Channel Setting Sensing Sensitivity: 2.5 mV
Pulse Gen Serial Number: 101958
Zone Setting Status: 755011

## 2021-12-30 ENCOUNTER — Encounter: Payer: Medicare Other | Admitting: Student

## 2022-01-22 ENCOUNTER — Telehealth: Payer: Self-pay

## 2022-01-22 NOTE — Patient Outreach (Signed)
  Care Coordination   01/22/2022 Name: Alan [name] MRN: [id number] DOB: 1931-04-05   Care Coordination Outreach Attempts:  An unsuccessful telephone outreach was attempted today to offer the patient information about available care coordination services as a benefit of their health plan.   Follow Up Plan:  Additional outreach attempts will be made to offer the patient care coordination information and services.   Encounter Outcome:  No Answer   Care Coordination Interventions:  No, not indicated    Tomasa Rand, RN, BSN, [facility] [employer] [phone]

## 2022-01-29 ENCOUNTER — Ambulatory Visit (INDEPENDENT_AMBULATORY_CARE_PROVIDER_SITE_OTHER): Payer: Medicare Other

## 2022-01-29 DIAGNOSIS — I495 Sick sinus syndrome: Secondary | ICD-10-CM

## 2022-01-29 LAB — CUP PACEART REMOTE DEVICE CHECK
Battery Remaining Longevity: 3 mo — CL
Battery Remaining Percentage: 3 %
Brady Statistic RA Percent Paced: 82 %
Brady Statistic RV Percent Paced: 0 %
Date Time Interrogation Session: 20231221032400
Implantable Lead Connection Status: 753985
Implantable Lead Connection Status: 753985
Implantable Lead Implant Date: 20150506
Implantable Lead Implant Date: 20150506
Implantable Lead Location: 753859
Implantable Lead Location: 753860
Implantable Lead Model: 4086
Implantable Lead Model: 4087
Implantable Lead Serial Number: 265257
Implantable Lead Serial Number: 310823
Implantable Pulse Generator Implant Date: 20150506
Lead Channel Impedance Value: 442 Ohm
Lead Channel Impedance Value: 920 Ohm
Lead Channel Setting Pacing Amplitude: 2 V
Lead Channel Setting Pacing Amplitude: 2.4 V
Lead Channel Setting Pacing Pulse Width: 0.4 ms
Lead Channel Setting Sensing Sensitivity: 2.5 mV
Pulse Gen Serial Number: 101958
Zone Setting Status: 755011

## 2022-01-30 ENCOUNTER — Telehealth: Payer: Self-pay

## 2022-01-30 NOTE — Patient Outreach (Signed)
  Care Coordination   Initial Visit Note   01/30/2022 Name: Alan [name] MRN: [id number] DOB: [birth year]  Alan [name] is a 88 y.o. year old male who sees Nicoletta Dress, MD for primary care. I spoke with  Alan [name] by phone today.Patient provided me permission to speak with wife.  What matters to the patients health and wellness today?  Placed call to patient and wife to review and offer [facility] care coordination program.  Wife reports that they are managing well. Denies any needs at this time.     SDOH assessments and interventions completed:       Care Coordination Interventions:     Follow up plan: No further intervention required.   Encounter Outcome:  Pt. Refused   Tomasa Rand, RN, BSN, CEN [facility] [employer] [phone]

## 2022-02-06 ENCOUNTER — Encounter: Payer: Self-pay | Admitting: Internal Medicine

## 2022-02-06 ENCOUNTER — Ambulatory Visit: Payer: Medicare Other | Attending: Internal Medicine | Admitting: Internal Medicine

## 2022-02-06 VITALS — BP 134/70 | HR 70 | Ht 67.0 in | Wt 158.0 lb

## 2022-02-06 DIAGNOSIS — I471 Supraventricular tachycardia, unspecified: Secondary | ICD-10-CM | POA: Insufficient documentation

## 2022-02-06 DIAGNOSIS — I495 Sick sinus syndrome: Secondary | ICD-10-CM | POA: Diagnosis not present

## 2022-02-06 DIAGNOSIS — Z950 Presence of cardiac pacemaker: Secondary | ICD-10-CM | POA: Insufficient documentation

## 2022-02-06 NOTE — Progress Notes (Addendum)
Patient Care Team: Nicoletta Dress, MD as PCP - General (Internal Medicine) Deboraha Sprang, MD as PCP - Electrophysiology (Cardiology)   HPI  Alan [name] is a 88 y.o. male Seen in follow-up for sinus bradycardia and chronotropic incompetence. was seen  at [facility]. Multiple discussions find that he received a Chiropractor at [facility] 2014  Some dementia   Hospitalized for SVT 1/23  Prescribed amio which he declined  SVT first episode in 4 years  more frightening than anything else   DATE TEST EF   7/18 Echo   53% %            Date Cr K Hgb TSH  1/23 1.06 4.1   2.74              Past Medical History:  Diagnosis Date   Anemia    Anxiety 07/19/2008   Qualifier: Diagnosis of  By: Hollie Salk CMA, Amanda     Arthritis    Bradycardia    chronotropic incompetence   Carotid stenosis    <60% 2012   Diabetes mellitus    Borderline   Diverticulosis    Esophageal stricture    Exercise intolerance    Hemorrhoids    Hernia    Hypertension    Lightheadedness 10/14/2010   Peptic ulcer disease    Prostate cancer ([place])    Vitamin B 12 deficiency     Past Surgical History:  Procedure Laterality Date   RETROPUBIC PROSTATECTOMY  2005   Radical   SKIN SURGERY     basal cell carcinoma    Current Outpatient Medications  Medication Sig Dispense Refill   alclomethasone (ACLOVATE) 0.05 % ointment Apply topically as needed.     ALPRAZolam (XANAX) 0.25 MG tablet Take 1 tablet by mouth at bedtime as needed. May take another tablet by mouth if awakens during the night  5   aspirin 81 MG tablet Take 81 mg by mouth daily.     b complex vitamins tablet Take 1 tablet by mouth daily.     Cholecalciferol (VITAMIN D PO) Take 2,000 Units by mouth daily.     Cinnamon 500 MG capsule Take 500 mg by mouth daily.     Coenzyme Q10 (CO Q 10) 100 MG CAPS Take 1 capsule by mouth daily.      CRESTOR 20 MG tablet Take 20 mg by mouth daily.   3   Cyanocobalamin (B-12  PO) Take 1 capsule by mouth daily.      donepezil (ARICEPT) 10 MG tablet Take 10 mg by mouth daily.     furosemide (LASIX) 20 MG tablet Take by mouth.     Garlic 654 MG CAPS Take 600 mg by mouth daily.      MAGNESIUM PO Take 1 tablet by mouth daily.      metoprolol succinate (TOPROL XL) 25 MG 24 hr tablet Take 0.5 tablets (12.5 mg total) by mouth daily. 45 tablet 3   mirtazapine (REMERON) 30 MG tablet Take 30 mg by mouth at bedtime.     Omega-3 Fatty Acids (OMEGA 3 PO) Take 1 capsule by mouth daily as needed (vitamin supllement).     triamcinolone ointment (KENALOG) 0.1 % Apply 1 Application topically 2 (two) times daily.     vitamin C (ASCORBIC ACID) 500 MG tablet Take 500 mg by mouth daily.     Xylometazoline HCl (OTRIVIN NA) Place 2 sprays into both nostrils daily as needed (nasal congestion).  Nasal spray as needed twice daily     No current facility-administered medications for this visit.    Allergies  Allergen Reactions   Cefdinir Other (See Comments)    confusion   Effexor [Venlafaxine] Other (See Comments)    "extremely dizzy"   Sulfa Antibiotics Other (See Comments) and Swelling   Amlodipine Besylate Swelling    Swelling of scrotum Swelling of scrotum   Aliskiren Other (See Comments)    Pt doesn't remember   Sulfamethoxazole     REACTION: throat swelled   Tekturna Hct [Aliskiren-Hydrochlorothiazide]     Lower back pain      Review of Systems negative except from HPI and PMH  Physical Exam BP 134/70   Pulse 70   Ht '5\' 7"'$  (1.702 m)   Wt 158 lb (71.7 kg)   SpO2 97%   BMI 24.75 kg/m  Well developed and well nourished in no acute distress HENT normal Neck supple with JVP-flat Clear Device pocket well healed; without hematoma or erythema.  There is no tethering  Regular rate and rhythm, no  gallop No / murmur Abd-soft with active BS No Clubbing cyanosis 2+edema Skin-warm and dry A & Oriented  Grossly normal sensory and motor function  ECG show pacing at  70 Intervals 16/08/39  Device function is normal. Programming changes none  See Paceart for details     Assessment and  Plan  Sinus node dysfunction  Pacemaker-[employer] prob SVT  Depression   Supraventricular tachycardia    Interval SVT but fortunately has had no symptoms.  With his progressive dementia, am loathe to add medications for asymptomatic tachycardia but will continue the metoprolol at 12 and half milligrams a day  Device is approaching ERI, have reviewed with his wife and him procedural issues  Volume overloaded.  We will have him increase his furosemide from 20--40 mg daily for 3 days

## 2022-02-06 NOTE — Addendum Note (Signed)
Addended by: Deboraha Sprang on: 02/06/2022 02:51 PM   Modules accepted: Level of Service

## 2022-02-06 NOTE — Patient Instructions (Signed)
Medication Instructions:  Your physician recommends that you continue on your current medications as directed. Please refer to the Current Medication list given to you today.  *If you need a refill on your cardiac medications before your next appointment, please call your pharmacy*  Follow-Up: At [facility], you and your health needs are our priority.  As part of our continuing mission to provide you with exceptional heart care, we have created designated Provider Care Teams.  These Care Teams include your primary Cardiologist (physician) and Advanced Practice Providers (APPs -  Physician Assistants and Nurse Practitioners) who all work together to provide you with the care you need, when you need it.  Your next appointment:   4 month(s)  The format for your next appointment:   In Person  Provider:   Tommye Standard, PA-C    Important Information About Sugar

## 2022-02-10 NOTE — Progress Notes (Signed)
Remote pacemaker transmission.   

## 2022-02-12 ENCOUNTER — Encounter: Payer: Self-pay | Admitting: Internal Medicine

## 2022-02-12 ENCOUNTER — Telehealth: Payer: Self-pay | Admitting: *Deleted

## 2022-02-12 ENCOUNTER — Telehealth: Payer: Self-pay

## 2022-02-12 NOTE — Telephone Encounter (Signed)
Alert received from [facility] regarding further episodes of SVT.  Outreach made to Pt's wife.  Again confirmed that Pt is asymptomatic with these episodes.  He cannot tell when his heart is racing.  Per last office note Dr. Caryl Comes did not want to make any med changes d/t Pt comorbidities.  Advised wife would forward observations to Dr. Caryl Comes but anticipate continued monitoring.    Continued episodes of SVT:

## 2022-02-12 NOTE — Telephone Encounter (Addendum)
Richardson Landry,  Good morning! You saw Alan [name] last week in clinic, hx of symptomatic bradycardia with PPM in place. He is now pending inguinal hernia repair. Do you recommend any additional cardiac testing prior to surgery?  Preop is happy to relay your recommendations to the requesting surgeon.  Thanks! Angie

## 2022-02-12 NOTE — Telephone Encounter (Signed)
ERROR, THIS IS FOR A PACEMAKER, WILL SEND TO EP.

## 2022-02-12 NOTE — Telephone Encounter (Deleted)
   Name: Alan [name]  DOB: December 18, 1931  MRN: [id number]   Primary Cardiologist: None  Chart reviewed as part of pre-operative protocol coverage.   We have been contacted for guidance to hold [place]. Per our pharmD:     I will route this recommendation to the requesting party via Epic fax function and remove from pre-op pool. Please call with questions.  [place], PA 02/12/2022, 8:05 AM

## 2022-02-12 NOTE — Telephone Encounter (Signed)
   Pre-operative Risk Assessment    Patient Name: Alan [name]  DOB: 1931/06/30 MRN: [id number]      Request for Surgical Clearance    Procedure:   OPEN REPAIR OF RIGHT INGUINAL HERNIA WITH MESH  Date of Surgery:  Clearance 02/23/22                                 Surgeon:  Lynford Citizen, MD Surgeon's Group or Practice Name:  [facility] Phone number:  [phone] Fax number:  [phone]   Type of Clearance Requested:   - Medical    Type of Anesthesia:  General    Additional requests/questions:  Please fax a copy of MOST RECENT LABS, MOST RECENT OFFICE NOTE, MOST RECENT EKG, STRESS TEST, AND ECHOCARDIOGRAM  to the surgeon's office.  Signed, Jeanann Lewandowsky   02/12/2022, 7:02 AM

## 2022-02-12 NOTE — Progress Notes (Signed)
PERIOPERATIVE PRESCRIPTION FOR IMPLANTED CARDIAC DEVICE PROGRAMMING  Patient Information: Name:  Alan [name]  DOB:  [birth year]  MRN:  [id number]  Request for Surgical Clearance     Procedure:   OPEN REPAIR OF RIGHT INGUINAL HERNIA WITH MESH   Date of Surgery:  Clearance 02/23/22                                 Surgeon:  Lynford Citizen, MD Surgeon's Group or Practice Name:  [facility] Phone number:  [phone] Fax number:  [phone]   Type of Clearance Requested:   - Medical    Type of Anesthesia:  General   Device Information:  Clinic EP Physician:  [name], MD   Device Type:  Pacemaker Manufacturer and Phone #:  [employer]: [phone] Pacemaker Dependent?:  No. Date of Last Device Check:  02/06/2022 Normal Device Function?:  Yes.    Electrophysiologist's Recommendations:  Have magnet available. Provide continuous ECG monitoring when magnet is used or reprogramming is to be performed.  Procedure should not interfere with device function.  No device programming or magnet placement needed.  Per Device Clinic Standing Orders, Damian Leavell, RN  7:05 AM 02/12/2022

## 2022-02-19 NOTE — Progress Notes (Signed)
Remote pacemaker transmission.   

## 2022-02-26 NOTE — Telephone Encounter (Signed)
     Primary Cardiologist: [name], MD  Chart reviewed as part of pre-operative protocol coverage. Given past medical history and time since last visit, based on ACC/AHA guidelines, MOSHE WENGER would be at acceptable risk for the planned procedure without further cardiovascular testing.   His RCRI is a class I risk, 0.4% risk of major cardiac event.  I will route this recommendation to the requesting party via Epic fax function and remove from pre-op pool.  Please call with questions.  Jossie Ng. Ayeza Therriault NP-C     02/26/2022, 1:27 PM [facility] [place] [address] Office [phone] Fax [phone]

## 2022-02-26 NOTE — Telephone Encounter (Signed)
Faxed surgical clearance over to [facility]

## 2022-03-02 ENCOUNTER — Ambulatory Visit: Payer: Medicare Other | Attending: Internal Medicine

## 2022-03-02 DIAGNOSIS — I495 Sick sinus syndrome: Secondary | ICD-10-CM

## 2022-03-03 LAB — CUP PACEART REMOTE DEVICE CHECK
Battery Remaining Longevity: 3 mo — CL
Battery Remaining Percentage: 3 %
Brady Statistic RA Percent Paced: 83 %
Brady Statistic RV Percent Paced: 0 %
Date Time Interrogation Session: 20240122102000
Implantable Lead Connection Status: 753985
Implantable Lead Connection Status: 753985
Implantable Lead Implant Date: 20150506
Implantable Lead Implant Date: 20150506
Implantable Lead Location: 753859
Implantable Lead Location: 753860
Implantable Lead Model: 4086
Implantable Lead Model: 4087
Implantable Lead Serial Number: 265257
Implantable Lead Serial Number: 310823
Implantable Pulse Generator Implant Date: 20150506
Lead Channel Impedance Value: 448 Ohm
Lead Channel Impedance Value: 936 Ohm
Lead Channel Setting Pacing Amplitude: 2 V
Lead Channel Setting Pacing Amplitude: 2.4 V
Lead Channel Setting Pacing Pulse Width: 0.4 ms
Lead Channel Setting Sensing Sensitivity: 2.5 mV
Pulse Gen Serial Number: 101958
Zone Setting Status: 755011

## 2022-04-02 ENCOUNTER — Ambulatory Visit (INDEPENDENT_AMBULATORY_CARE_PROVIDER_SITE_OTHER): Payer: Medicare Other

## 2022-04-02 DIAGNOSIS — I495 Sick sinus syndrome: Secondary | ICD-10-CM

## 2022-04-06 LAB — CUP PACEART REMOTE DEVICE CHECK
Battery Remaining Longevity: 3 mo — CL
Battery Remaining Percentage: 0 %
Brady Statistic RA Percent Paced: 80 %
Brady Statistic RV Percent Paced: 0 %
Date Time Interrogation Session: 20240226031100
Implantable Lead Connection Status: 753985
Implantable Lead Connection Status: 753985
Implantable Lead Implant Date: 20150506
Implantable Lead Implant Date: 20150506
Implantable Lead Location: 753859
Implantable Lead Location: 753860
Implantable Lead Model: 4086
Implantable Lead Model: 4087
Implantable Lead Serial Number: 265257
Implantable Lead Serial Number: 310823
Implantable Pulse Generator Implant Date: 20150506
Lead Channel Impedance Value: 1015 Ohm
Lead Channel Impedance Value: 460 Ohm
Lead Channel Setting Pacing Amplitude: 2 V
Lead Channel Setting Pacing Amplitude: 2.4 V
Lead Channel Setting Pacing Pulse Width: 0.4 ms
Lead Channel Setting Sensing Sensitivity: 2.5 mV
Pulse Gen Serial Number: 101958
Zone Setting Status: 755011

## 2022-04-15 NOTE — Progress Notes (Signed)
Remote pacemaker transmission.   

## 2022-04-27 NOTE — Addendum Note (Signed)
Addended by: Cheri Kearns A on: 04/27/2022 11:56 AM   Modules accepted: Level of Service

## 2022-04-27 NOTE — Progress Notes (Signed)
Remote pacemaker transmission.   

## 2022-05-04 ENCOUNTER — Ambulatory Visit (INDEPENDENT_AMBULATORY_CARE_PROVIDER_SITE_OTHER): Payer: Medicare Other

## 2022-05-04 DIAGNOSIS — I495 Sick sinus syndrome: Secondary | ICD-10-CM

## 2022-05-04 LAB — CUP PACEART REMOTE DEVICE CHECK
Battery Remaining Longevity: 3 mo — CL
Battery Remaining Percentage: 0 %
Brady Statistic RA Percent Paced: 77 %
Brady Statistic RV Percent Paced: 0 %
Date Time Interrogation Session: 20240325041600
Implantable Lead Connection Status: 753985
Implantable Lead Connection Status: 753985
Implantable Lead Implant Date: 20150506
Implantable Lead Implant Date: 20150506
Implantable Lead Location: 753859
Implantable Lead Location: 753860
Implantable Lead Model: 4086
Implantable Lead Model: 4087
Implantable Lead Serial Number: 265257
Implantable Lead Serial Number: 310823
Implantable Pulse Generator Implant Date: 20150506
Lead Channel Impedance Value: 1035 Ohm
Lead Channel Impedance Value: 486 Ohm
Lead Channel Setting Pacing Amplitude: 2 V
Lead Channel Setting Pacing Amplitude: 2.4 V
Lead Channel Setting Pacing Pulse Width: 0.4 ms
Lead Channel Setting Sensing Sensitivity: 2.5 mV
Pulse Gen Serial Number: 101958
Zone Setting Status: 755011

## 2022-05-08 ENCOUNTER — Telehealth: Payer: Self-pay

## 2022-05-08 NOTE — Telephone Encounter (Signed)
Pacemaker reached ERI 05/07/2022. Called and spoke to patient wife Aamer Barricklow to advise. Please call patients wife- [phone].  Patient has apt on 06/04/22 with Renee already. Routing to see if apt needs to be moved up.

## 2022-05-08 NOTE — Telephone Encounter (Signed)
Spoke with pt's wife,DPR and advised once device reaches ERI generator has 3 months to be replaced and pt should plan to keep current appointment as scheduled with Tommye Standard, PA-C.  Pt's wife verbalizes understanding and agrees with current plan.

## 2022-05-25 ENCOUNTER — Encounter: Payer: Self-pay | Admitting: *Deleted

## 2022-05-25 NOTE — Telephone Encounter (Signed)
Spoke to wife Pt scheduled for gen change 5/8 w/ Graciela Husbands. Aware will obtain blood work and review procedure instructions at next weeks OV w/ Francis Dowse, PA. Aware they will also obtain procedure scrub on that day. Wife verbalized understanding and agreeable to plan.

## 2022-06-02 NOTE — Progress Notes (Unsigned)
Cardiology Office Note Date:  06/02/2022  Patient ID:  Alan [name] 1931/12/12, MRN [id number] PCP:  Paulina Fusi, MD  Electrophysiologist:  Dr. Graciela Husbands    Chief Complaint:  *** ERI  History of Present Illness: Alan [name] is a 89 y.o. male with history of prostate  (treated surgically), HTN, anxiety/depression, dementia, symptomatic bradycardia w/PPM.  He comes today to be seen for Dr. Graciela Husbands, he last saw him 04/14/21, discussed an SVT the 1st in years, anxiety provoking, asymptomatic otherwise.  Planned for PRN verapamil 40mg  given infrequency of his SVT   Device clinic with some notes regarding labled VT episodes, discussed short V-A intervals  I saw him 09/04/21, He comes today with his wife He is slowing, particularly since COVID shut down, never really got his energy/strngth back under him from being cooped up in home. He drifts to sleep today intermittently. He is very sedentary They both report though otherwise doing well, no symptoms of his tachycardia, no noted racing heart. No CP, no SOB No near syncope or syncope. His wife reports that they are here to keep an eye on his device battery, the patient reports no symptoms and otherwise would not have been coming in. He never did start the amiodarone They both have strong reservations about the medication, warned by his pharmacists of sever bradycardia with his Aricept and could cause a heart attack. Despite prior conversations that given his pacer bradycardia not a concern, they remain with reservations. They both have reservations about starting any new medicines. Device w/SVT's and started on verapamil daily  He has seen Mardelle Matte and Dr. Graciela Husbands since, most recently 02/06/22, more SVT but asymptomatic, with advancing dementia, continued metoprolol, felt to be volume OL for a few days.  Near ERI discussed procedure with patient/wife.  Device alert, ERI reached 05/07/22.  *** symptoms *** SVTs *** procedure   Device  information: BSci dual chamber PPM, implanted 06/14/13 (Dr. Sharol Harness)   Past Medical History:  Diagnosis Date   Anemia    Anxiety 07/19/2008   Qualifier: Diagnosis of  By: Wyn Quaker CMA, Marchelle Folks     Arthritis    Bradycardia    chronotropic incompetence   Carotid stenosis    <60% 2012   Diabetes mellitus    Borderline   Diverticulosis    Esophageal stricture    Exercise intolerance    Hemorrhoids    Hernia    Hypertension    Lightheadedness 10/14/2010   Peptic ulcer disease    Prostate cancer (HCC)    Vitamin B 12 deficiency     Past Surgical History:  Procedure Laterality Date   RETROPUBIC PROSTATECTOMY  2005   Radical   SKIN SURGERY     basal cell carcinoma    Current Outpatient Medications  Medication Sig Dispense Refill   alclomethasone (ACLOVATE) 0.05 % ointment Apply topically as needed.     ALPRAZolam (XANAX) 0.25 MG tablet Take 1 tablet by mouth at bedtime as needed. May take another tablet by mouth if awakens during the night  5   aspirin 81 MG tablet Take 81 mg by mouth daily.     b complex vitamins tablet Take 1 tablet by mouth daily.     Cholecalciferol (VITAMIN D PO) Take 2,000 Units by mouth daily.     Cinnamon 500 MG capsule Take 500 mg by mouth daily.     Coenzyme Q10 (CO Q 10) 100 MG CAPS Take 1 capsule by mouth daily.  CRESTOR 20 MG tablet Take 20 mg by mouth daily.   3   Cyanocobalamin (B-12 PO) Take 1 capsule by mouth daily.      donepezil (ARICEPT) 10 MG tablet Take 10 mg by mouth daily.     furosemide (LASIX) 20 MG tablet Take by mouth.     Garlic 600 MG CAPS Take 600 mg by mouth daily.      MAGNESIUM PO Take 1 tablet by mouth daily.      metoprolol succinate (TOPROL XL) 25 MG 24 hr tablet Take 0.5 tablets (12.5 mg total) by mouth daily. 45 tablet 3   mirtazapine (REMERON) 30 MG tablet Take 30 mg by mouth at bedtime.     Omega-3 Fatty Acids (OMEGA 3 PO) Take 1 capsule by mouth daily as needed (vitamin supllement).     triamcinolone ointment  (KENALOG) 0.1 % Apply 1 Application topically 2 (two) times daily.     vitamin C (ASCORBIC ACID) 500 MG tablet Take 500 mg by mouth daily.     Xylometazoline HCl (OTRIVIN NA) Place 2 sprays into both nostrils daily as needed (nasal congestion). Nasal spray as needed twice daily     No current facility-administered medications for this visit.    Allergies:   Cefdinir, Effexor [venlafaxine], Sulfa antibiotics, Amlodipine besylate, Aliskiren, Sulfamethoxazole, and Tekturna hct [aliskiren-hydrochlorothiazide]   Social History:  The patient  reports that he has never smoked. He has never used smokeless tobacco. He reports that he does not drink alcohol and does not use drugs.   Family History:  The patient's family history includes Cancer in his father and paternal grandfather; Heart disease in his mother; Stroke in his father and mother.  ROS:  Please see the history of present illness. All other systems are reviewed and otherwise negative.   PHYSICAL EXAM:  VS:  There were no vitals taken for this visit. BMI: There is no height or weight on file to calculate BMI. Well nourished, well developed, in no acute distress  HEENT: normocephalic, atraumatic  Neck: no JVD, carotid bruits or masses Cardiac: *** RRR; no significant murmurs, no rubs, or gallops Lungs: *** CTA b/l, no wheezing, rhonchi or rales  Abd: soft, nontender MS: no deformity, age appropriate/advanced atrophy Ext: *** trace edema  Skin: warm and dry, no rash Neuro:  No gross deficits appreciated Psych: euthymic mood, full affect  *** PPM site is stable, no tethering or discomfort   EKG:  done today and reviewed by myself ***   PPM interrogation done today and reviewed by myself:  ***   07/14/12: Stress myoview Impression Exercise Capacity:  Lexiscan with low level exercise. BP Response:  Normal blood pressure response. Clinical Symptoms:  The exercise was limited by fatigue. Walked 9:20 but unable to get heart rate  above 103 so he was switched to [employer].. ECG Impression:  No significant ST segment change suggestive of ischemia. Comparison with Prior Nuclear Study: No images to compare Overall Impression:  Low risk stress nuclear study . No ischemia. Patient demonstrates chronotropic incompetence..   LV Ejection Fraction: 62%.  LV Wall Motion:  NL LV Function; NL Wall Motion   08/30/07: TTE  SUMMARY  -  Overall left ventricular systolic function was normal. There were        no left ventricular regional wall motion abnormalities.        Doppler parameters were consistent with abnormal left        ventricular relaxation.  -  Left atrial size was  at the upper limits of normal.  Recent Labs: No results found for requested labs within last 365 days.  No results found for requested labs within last 365 days.   CrCl cannot be calculated (Patient's most recent lab result is older than the maximum 21 days allowed.).   Wt Readings from Last 3 Encounters:  02/06/22 158 lb (71.7 kg)  10/07/21 156 lb 12.8 oz (71.1 kg)  09/04/21 150 lb 12.8 oz (68.4 kg)     Other studies reviewed: Additional studies/records reviewed today include: summarized above  ASSESSMENT AND PLAN:  1. PPM     ***  2. SVT     ***  3. HTN     ***   Disposition: ***  Current medicines are reviewed at length with the patient today.  The patient did not have any concerns regarding medicines.  Norma Fredrickson, PA-C 06/02/2022 5:29 PM     [facility] [address] [place] [address] [phone] (office)  [phone] (fax)

## 2022-06-02 NOTE — H&P (View-Only) (Signed)
Cardiology Office Note Date:  06/02/2022  Patient ID:  Alan [name], Alan [name] 25-Jun-1931, MRN [id number] PCP:  Paulina Fusi, MD  Electrophysiologist:  Dr. Graciela Husbands    Chief Complaint:   ERI  History of Present Illness: Alan [name] is a 89 y.o. male with history of prostate  (treated surgically), HTN, anxiety/depression, dementia, symptomatic bradycardia w/PPM.  He comes today to be seen for Dr. Graciela Husbands, he last saw him 04/14/21, discussed an SVT the 1st in years, anxiety provoking, asymptomatic otherwise.  Planned for PRN verapamil 40mg  given infrequency of his SVT   Device clinic with some notes regarding labled VT episodes, discussed short V-A intervals  I saw him 09/04/21, He comes today with his wife He is slowing, particularly since COVID shut down, never really got his energy/strngth back under him from being cooped up in home. He drifts to sleep today intermittently. He is very sedentary They both report though otherwise doing well, no symptoms of his tachycardia, no noted racing heart. No CP, no SOB No near syncope or syncope. His wife reports that they are here to keep an eye on his device battery, the patient reports no symptoms and otherwise would not have been coming in. He never did start the amiodarone They both have strong reservations about the medication, warned by his pharmacists of sever bradycardia with his Aricept and could cause a heart attack. Despite prior conversations that given his pacer bradycardia not a concern, they remain with reservations. They both have reservations about starting any new medicines. Device w/SVT's and started on verapamil daily  He has seen Mardelle Matte and Dr. Graciela Husbands since, most recently 02/06/22, more SVT but asymptomatic, with advancing dementia, continued metoprolol, felt to be volume OL for a few days.  Near ERI discussed procedure with patient/wife.  Device alert, ERI reached 05/07/22.  TODAY He is accompanied by his wife. He had an SVT  last weekend that was symptomatic with weakness, and went to the ER at [facility], his wife reports they game him "that IV medicine" and stopped it. None since This is the 1st in a long time that lasted and he was symptomatic with.  Otherwise no cardiac concerns, they are aware of his PPM battery and gen change procedure date. No reorts of CP, SOB No near syncope or syncope.    Device information: BSci dual chamber PPM, implanted 06/14/13 (Dr. Sharol Harness)   Past Medical History:  Diagnosis Date   Anemia    Anxiety 07/19/2008   Qualifier: Diagnosis of  By: Wyn Quaker CMA, Marchelle Folks     Arthritis    Bradycardia    chronotropic incompetence   Carotid stenosis    <60% 2012   Diabetes mellitus    Borderline   Diverticulosis    Esophageal stricture    Exercise intolerance    Hemorrhoids    Hernia    Hypertension    Lightheadedness 10/14/2010   Peptic ulcer disease    Prostate cancer (HCC)    Vitamin B 12 deficiency     Past Surgical History:  Procedure Laterality Date   RETROPUBIC PROSTATECTOMY  2005   Radical   SKIN SURGERY     basal cell carcinoma    Current Outpatient Medications  Medication Sig Dispense Refill   alclomethasone (ACLOVATE) 0.05 % ointment Apply topically as needed.     ALPRAZolam (XANAX) 0.25 MG tablet Take 1 tablet by mouth at bedtime as needed. May take another tablet by mouth if awakens during the night  5   aspirin 81 MG tablet Take 81 mg by mouth daily.     b complex vitamins tablet Take 1 tablet by mouth daily.     Cholecalciferol (VITAMIN D PO) Take 2,000 Units by mouth daily.     Cinnamon 500 MG capsule Take 500 mg by mouth daily.     Coenzyme Q10 (CO Q 10) 100 MG CAPS Take 1 capsule by mouth daily.      CRESTOR 20 MG tablet Take 20 mg by mouth daily.   3   Cyanocobalamin (B-12 PO) Take 1 capsule by mouth daily.      donepezil (ARICEPT) 10 MG tablet Take 10 mg by mouth daily.     furosemide (LASIX) 20 MG tablet Take by mouth.     Garlic 600 MG CAPS Take  600 mg by mouth daily.      MAGNESIUM PO Take 1 tablet by mouth daily.      metoprolol succinate (TOPROL XL) 25 MG 24 hr tablet Take 0.5 tablets (12.5 mg total) by mouth daily. 45 tablet 3   mirtazapine (REMERON) 30 MG tablet Take 30 mg by mouth at bedtime.     Omega-3 Fatty Acids (OMEGA 3 PO) Take 1 capsule by mouth daily as needed (vitamin supllement).     triamcinolone ointment (KENALOG) 0.1 % Apply 1 Application topically 2 (two) times daily.     vitamin C (ASCORBIC ACID) 500 MG tablet Take 500 mg by mouth daily.     Xylometazoline HCl (OTRIVIN NA) Place 2 sprays into both nostrils daily as needed (nasal congestion). Nasal spray as needed twice daily     No current facility-administered medications for this visit.    Allergies:   Cefdinir, Effexor [venlafaxine], Sulfa antibiotics, Amlodipine besylate, Aliskiren, Sulfamethoxazole, and Tekturna hct [aliskiren-hydrochlorothiazide]   Social History:  The patient  reports that he has never smoked. He has never used smokeless tobacco. He reports that he does not drink alcohol and does not use drugs.   Family History:  The patient's family history includes Cancer in his father and paternal grandfather; Heart disease in his mother; Stroke in his father and mother.  ROS:  Please see the history of present illness. All other systems are reviewed and otherwise negative.   PHYSICAL EXAM:  VS:  There were no vitals taken for this visit. BMI: There is no height or weight on file to calculate BMI. Well nourished, well developed, in no acute distress  HEENT: normocephalic, atraumatic  Neck: no JVD, carotid bruits or masses Cardiac: RRR; no significant murmurs, no rubs, or gallops Lungs: CTA b/l, no wheezing, rhonchi or rales  Abd: soft, nontender MS: no deformity, age appropriate/advanced atrophy Ext: trace edema  Skin: warm and dry, no rash Neuro:  No gross deficits appreciated Psych: euthymic mood, full affect  PPM site is stable, no  tethering or discomfort   EKG:  done today and reviewed by myself AP/VS 70bpm   PPM interrogation done today and reviewed by myself:  Time to gen change is <30 Lead measurements are good + SVTs, some labeled NSVT/VT though all available EGMs are reviewed and all are SVT   07/14/12: Stress myoview Impression Exercise Capacity:  Lexiscan with low level exercise. BP Response:  Normal blood pressure response. Clinical Symptoms:  The exercise was limited by fatigue. Walked 9:20 but unable to get heart rate above 103 so he was switched to [employer].. ECG Impression:  No significant ST segment change suggestive of ischemia. Comparison with Prior Nuclear  Study: No images to compare Overall Impression:  Low risk stress nuclear study . No ischemia. Patient demonstrates chronotropic incompetence..   LV Ejection Fraction: 62%.  LV Wall Motion:  NL LV Function; NL Wall Motion   08/30/07: TTE  SUMMARY  -  Overall left ventricular systolic function was normal. There were        no left ventricular regional wall motion abnormalities.        Doppler parameters were consistent with abnormal left        ventricular relaxation.  -  Left atrial size was at the upper limits of normal.  Recent Labs: No results found for requested labs within last 365 days.  No results found for requested labs within last 365 days.   CrCl cannot be calculated (Patient's most recent lab result is older than the maximum 21 days allowed.).   Wt Readings from Last 3 Encounters:  02/06/22 158 lb (71.7 kg)  10/07/21 156 lb 12.8 oz (71.1 kg)  09/04/21 150 lb 12.8 oz (68.4 kg)     Other studies reviewed: Additional studies/records reviewed today include: summarized above  ASSESSMENT AND PLAN:  1. PPM     ERI  We discussed procedure, potential risks/benefits, they are agreeable to proceed   2. SVT     Will go ahead and increase his metoprolol to full table 25mg  daily since he had an ER visit with it  recently  3. HTN     Looks good, there is room for more BB   Disposition: usual post procedure follow up, sooner if needed  Current medicines are reviewed at length with the patient today.  The patient did not have any concerns regarding medicines.  Norma Fredrickson, PA-C 06/02/2022 5:29 PM     [facility] [address] [place] [address] [phone] (office)  [phone] (fax)

## 2022-06-03 ENCOUNTER — Encounter: Payer: Self-pay | Admitting: Physician Assistant

## 2022-06-03 ENCOUNTER — Telehealth: Payer: Self-pay

## 2022-06-03 ENCOUNTER — Ambulatory Visit: Payer: Medicare Other | Attending: Physician Assistant | Admitting: Physician Assistant

## 2022-06-03 VITALS — BP 130/90 | HR 70 | Ht 67.0 in | Wt 157.2 lb

## 2022-06-03 DIAGNOSIS — Z950 Presence of cardiac pacemaker: Secondary | ICD-10-CM | POA: Diagnosis present

## 2022-06-03 DIAGNOSIS — I10 Essential (primary) hypertension: Secondary | ICD-10-CM | POA: Insufficient documentation

## 2022-06-03 DIAGNOSIS — I471 Supraventricular tachycardia, unspecified: Secondary | ICD-10-CM | POA: Insufficient documentation

## 2022-06-03 DIAGNOSIS — I495 Sick sinus syndrome: Secondary | ICD-10-CM | POA: Diagnosis present

## 2022-06-03 LAB — CUP PACEART INCLINIC DEVICE CHECK
Date Time Interrogation Session: 20240424150225
Implantable Lead Connection Status: 753985
Implantable Lead Connection Status: 753985
Implantable Lead Implant Date: 20150506
Implantable Lead Implant Date: 20150506
Implantable Lead Location: 753859
Implantable Lead Location: 753860
Implantable Lead Model: 4086
Implantable Lead Model: 4087
Implantable Lead Serial Number: 265257
Implantable Lead Serial Number: 310823
Implantable Pulse Generator Implant Date: 20150506
Lead Channel Impedance Value: 460 Ohm
Lead Channel Impedance Value: 958 Ohm
Lead Channel Pacing Threshold Amplitude: 0.7 V
Lead Channel Pacing Threshold Amplitude: 0.8 V
Lead Channel Pacing Threshold Pulse Width: 0.4 ms
Lead Channel Pacing Threshold Pulse Width: 0.4 ms
Lead Channel Sensing Intrinsic Amplitude: 16.3 mV
Lead Channel Sensing Intrinsic Amplitude: 5.4 mV
Lead Channel Setting Pacing Amplitude: 2 V
Lead Channel Setting Pacing Amplitude: 2.4 V
Lead Channel Setting Pacing Pulse Width: 0.4 ms
Lead Channel Setting Sensing Sensitivity: 2.5 mV
Pulse Gen Serial Number: 101958
Zone Setting Status: 755011

## 2022-06-03 MED ORDER — METOPROLOL SUCCINATE ER 25 MG PO TB24: 25.0000 mg | ORAL_TABLET | Freq: Every day | ORAL | 2 refills | Status: DC

## 2022-06-03 NOTE — Patient Instructions (Addendum)
Medication Instructions:   START TAKING: METOPROLOL  25 MG ONCE A DAY   *If you need a refill on your cardiac medications before your next appointment, please call your pharmacy*   Lab Work:  BMET AND CBC TODAY   If you have labs (blood work) drawn today and your tests are completely normal, you will receive your results only by: MyChart Message (if you have MyChart) OR A paper copy in the mail If you have any lab test that is abnormal or we need to change your treatment, we will call you to review the results.   Testing/Procedures:  SEE LETTER FOR  GEN CHANGE  ON 06-17-22 WITH DR Graciela Husbands     Follow-Up: At [facility], you and your health needs are our priority.  As part of our continuing mission to provide you with exceptional heart care, we have created designated Provider Care Teams.  These Care Teams include your primary Cardiologist (physician) and Advanced Practice Providers (APPs -  Physician Assistants and Nurse Practitioners) who all work together to provide you with the care you need, when you need it.  We recommend signing up for the patient portal called "MyChart".  Sign up information is provided on this After Visit Summary.  MyChart is used to connect with patients for Virtual Visits (Telemedicine).  Patients are able to view lab/test results, encounter notes, upcoming appointments, etc.  Non-urgent messages can be sent to your provider as well.   To learn more about what you can do with MyChart, go to [email or web address].    Your next appointment:  after 06-17-22 10-14  DAYS DEVICE CLINIC   3 month(s)  Provider:   Sherryl Manges, MD

## 2022-06-03 NOTE — Telephone Encounter (Signed)
     Patient  visit on 05/30/2022  at [facility] was for treatment.  Have you been able to follow up with your primary care physician? Patient has followed up with Cardiologist.  The patient was or was not able to obtain any needed medicine or equipment. No medication prescribed.  Are there diet recommendations that you are having difficulty following? No  Patient expresses understanding of discharge instructions and education provided has no other needs at this time. Yes   Aarna Mihalko [facility]  [facility] Population Health Community Resource Care Guide   [email or web address]  ?? [phone]   Website: [email or web address]  [email or web address]

## 2022-06-04 ENCOUNTER — Ambulatory Visit (INDEPENDENT_AMBULATORY_CARE_PROVIDER_SITE_OTHER): Payer: Medicare Other

## 2022-06-04 ENCOUNTER — Telehealth: Payer: Self-pay

## 2022-06-04 DIAGNOSIS — I495 Sick sinus syndrome: Secondary | ICD-10-CM

## 2022-06-04 LAB — BASIC METABOLIC PANEL
BUN/Creatinine Ratio: 19 (ref 10–24)
BUN: 23 mg/dL (ref 8–27)
CO2: 22 mmol/L (ref 20–29)
Calcium: 9.3 mg/dL (ref 8.6–10.2)
Chloride: 102 mmol/L (ref 96–106)
Creatinine, Ser: 1.23 mg/dL (ref 0.76–1.27)
Glucose: 90 mg/dL (ref 70–99)
Potassium: 4.2 mmol/L (ref 3.5–5.2)
Sodium: 140 mmol/L (ref 134–144)
eGFR: 56 mL/min/{1.73_m2} — ABNORMAL LOW (ref >59)

## 2022-06-04 LAB — CBC
Hematocrit: 42.1 % (ref 37.5–51.0)
Hemoglobin: 14.2 g/dL (ref 13.0–17.7)
MCH: 31.9 pg (ref 26.6–33.0)
MCHC: 33.7 g/dL (ref 31.5–35.7)
MCV: 95 fL (ref 79–97)
Platelets: 209 10*3/uL (ref 150–450)
RBC: 4.45 x10E6/uL (ref 4.14–5.80)
RDW: 12.6 % (ref 11.6–15.4)
WBC: 7.1 10*3/uL (ref 3.4–10.8)

## 2022-06-04 NOTE — Telephone Encounter (Signed)
Attempted phone call to pt.  OK per EPIC to leave detailed voicemail message.  Pt advised due to an emergency on 06/17/2022 pt's device generator change will need to be moved to arrival at 1pm with procedure to start at 3pm.  Requested pt contact RN at [phone] to verify he did receive voicemail message.

## 2022-06-05 NOTE — Telephone Encounter (Signed)
Spoke with pt's wife, Scarlette Calico, pt's DPR.  She reports pt received voicemail message and they are aware pt will need to now arrive at 1pm on 06/17/2022 for device generator change at [facility].  Pt's wife thanked Char[employer] fundraiser for the call.

## 2022-06-08 LAB — CUP PACEART REMOTE DEVICE CHECK
Battery Remaining Longevity: 3 mo — CL
Battery Remaining Percentage: 0 %
Brady Statistic RA Percent Paced: 74 %
Brady Statistic RV Percent Paced: 0 %
Date Time Interrogation Session: 20240426160400
Implantable Lead Connection Status: 753985
Implantable Lead Connection Status: 753985
Implantable Lead Implant Date: 20150506
Implantable Lead Implant Date: 20150506
Implantable Lead Location: 753859
Implantable Lead Location: 753860
Implantable Lead Model: 4086
Implantable Lead Model: 4087
Implantable Lead Serial Number: 265257
Implantable Lead Serial Number: 310823
Implantable Pulse Generator Implant Date: 20150506
Lead Channel Impedance Value: 440 Ohm
Lead Channel Impedance Value: 930 Ohm
Lead Channel Setting Pacing Amplitude: 2 V
Lead Channel Setting Pacing Amplitude: 2.4 V
Lead Channel Setting Pacing Pulse Width: 0.4 ms
Lead Channel Setting Sensing Sensitivity: 2.5 mV
Pulse Gen Serial Number: 101958
Zone Setting Status: 755011

## 2022-06-11 NOTE — Progress Notes (Signed)
Remote pacemaker transmission.   

## 2022-06-16 NOTE — Pre-Procedure Instructions (Signed)
Instructed patient on the following items: Arrival time 1200 Nothing to eat or drink after midnight No meds AM of procedure Responsible person to drive you home and stay with you for 24 hrs Wash with special soap night before and morning of procedure  

## 2022-06-17 ENCOUNTER — Ambulatory Visit (HOSPITAL_COMMUNITY)
Admission: RE | Admit: 2022-06-17 | Discharge: 2022-06-17 | Disposition: A | Discharge status: Home / Self Care | Payer: Medicare Other | Attending: Internal Medicine | Admitting: Internal Medicine

## 2022-06-17 ENCOUNTER — Other Ambulatory Visit: Payer: Self-pay

## 2022-06-17 ENCOUNTER — Encounter (HOSPITAL_COMMUNITY)
Admission: RE | Disposition: A | Discharge status: Home / Self Care | Payer: Self-pay | Source: Home / Self Care | Attending: Internal Medicine

## 2022-06-17 DIAGNOSIS — I471 Supraventricular tachycardia, unspecified: Secondary | ICD-10-CM | POA: Diagnosis not present

## 2022-06-17 DIAGNOSIS — F0394 Unspecified dementia, unspecified severity, with anxiety: Secondary | ICD-10-CM | POA: Diagnosis not present

## 2022-06-17 DIAGNOSIS — Z45010 Encounter for checking and testing of cardiac pacemaker pulse generator [battery]: Secondary | ICD-10-CM | POA: Insufficient documentation

## 2022-06-17 DIAGNOSIS — I10 Essential (primary) hypertension: Secondary | ICD-10-CM | POA: Insufficient documentation

## 2022-06-17 DIAGNOSIS — Z79899 Other long term (current) drug therapy: Secondary | ICD-10-CM | POA: Insufficient documentation

## 2022-06-17 DIAGNOSIS — Z8546 Personal history of malignant neoplasm of prostate: Secondary | ICD-10-CM | POA: Insufficient documentation

## 2022-06-17 DIAGNOSIS — F32A Depression, unspecified: Secondary | ICD-10-CM | POA: Diagnosis not present

## 2022-06-17 DIAGNOSIS — I495 Sick sinus syndrome: Secondary | ICD-10-CM | POA: Diagnosis not present

## 2022-06-17 HISTORY — PX: PPM GENERATOR CHANGEOUT: EP1233

## 2022-06-17 SURGERY — PPM GENERATOR CHANGEOUT

## 2022-06-17 MED ORDER — SODIUM CHLORIDE 0.9 % IV SOLN
80.0000 mg | INTRAVENOUS | Status: AC
  Administered 2022-06-17: 80 mg

## 2022-06-17 MED ORDER — VANCOMYCIN HCL IN DEXTROSE 1-5 GM/200ML-% IV SOLN
INTRAVENOUS | Status: AC
  Filled 2022-06-17: qty 200

## 2022-06-17 MED ORDER — LIDOCAINE HCL (PF) 1 % IJ SOLN
INTRAMUSCULAR | Status: DC | PRN
  Administered 2022-06-17: 60 mL

## 2022-06-17 MED ORDER — SODIUM CHLORIDE 0.9 % IV SOLN: INTRAVENOUS | Status: DC

## 2022-06-17 MED ORDER — ACETAMINOPHEN 325 MG PO TABS: 325.0000 mg | ORAL_TABLET | ORAL | Status: DC | PRN

## 2022-06-17 MED ORDER — HEPARIN (PORCINE) IN NACL 1000-0.9 UT/500ML-% IV SOLN: INTRAVENOUS | Status: DC | PRN

## 2022-06-17 MED ORDER — CHLORHEXIDINE GLUCONATE 4 % EX LIQD: 4.0000 | Freq: Once | CUTANEOUS | Status: DC

## 2022-06-17 MED ORDER — VANCOMYCIN HCL IN DEXTROSE 1-5 GM/200ML-% IV SOLN
1000.0000 mg | INTRAVENOUS | Status: AC
  Administered 2022-06-17: 1000 mg via INTRAVENOUS

## 2022-06-17 MED ORDER — SODIUM CHLORIDE 0.9 % IV SOLN
INTRAVENOUS | Status: AC
  Filled 2022-06-17: qty 2

## 2022-06-17 SURGICAL SUPPLY — 4 items
CABLE SURGICAL S-101-97-12 (CABLE) ×1 IMPLANT
PACEMAKER ACCOLADE GR (Pacemaker) IMPLANT
PAD DEFIB RADIO PHYSIO CONN (PAD) ×1 IMPLANT
TRAY PACEMAKER INSERTION (PACKS) ×1 IMPLANT

## 2022-06-17 NOTE — Discharge Instructions (Signed)
Implantable Cardiac Device Battery Change, Care After  This sheet gives you information about how to care for yourself after your procedure. Your health care provider may also give you more specific instructions. If you have problems or questions, contact your health care provider. What can I expect after the procedure? After your procedure, it is common to have: Pain or soreness at the site where the cardiac device was inserted. Swelling at the site where the cardiac device was inserted. You should received an information card for your new device in 4-8 weeks. Follow these instructions at home: Incision care  Keep the incision clean and dry. REMOVE OUTER DRESSING IN 24 HOURS Do not take baths, swim, or use a hot tub until after your wound check.  Do not shower for at least 7 days, or as directed by your health care provider. Pat the area dry with a clean towel. Do not rub the area. This may cause bleeding. Follow instructions from your health care provider about how to take care of your incision. Make sure you: Leave  adhesive strips in place. These skin closures may need to stay in place for 2 weeks or longer. If adhesive strip edges start to loosen and curl up, you may trim the loose edges. Do not remove adhesive strips completely unless your health care provider tells you to do that. Check your incision area every day for signs of infection. Check for: More redness, swelling, or pain. More fluid or blood. Warmth. Pus or a bad smell. Activity Do not lift anything that is heavier than 10 lb (4.5 kg) until your health care provider says it is okay to do so. For the first week, or as long as told by your health care provider: Avoid lifting your affected arm higher than your shoulder. After 1 week, Be gentle when you move your arms over your head. It is okay to raise your arm to comb your hair. Avoid strenuous exercise. Ask your health care provider when it is okay to: Resume your normal  activities. Return to work or school. Resume sexual activity. Eating and drinking Eat a heart-healthy diet. This should include plenty of fresh fruits and vegetables, whole grains, low-fat dairy products, and lean protein like chicken and fish. Limit alcohol intake to no more than 1 drink a day for non-pregnant women and 2 drinks a day for men. One drink equals 12 oz of beer, 5 oz of wine, or 1 oz of hard liquor. Check ingredients and nutrition facts on packaged foods and beverages. Avoid the following types of food: Food that is high in salt (sodium). Food that is high in saturated fat, like full-fat dairy or red meat. Food that is high in trans fat, like fried food. Food and drinks that are high in sugar. Lifestyle Do not use any products that contain nicotine or tobacco, such as cigarettes and e-cigarettes. If you need help quitting, ask your health care provider. Take steps to manage and control your weight. Once cleared, get regular exercise. Aim for 150 minutes of moderate-intensity exercise (such as walking or yoga) or 75 minutes of vigorous exercise (such as running or swimming) each week. Manage other health problems, such as diabetes or high blood pressure. Ask your health care provider how you can manage these conditions. General instructions Do not drive for 24 hours after your procedure if you were given a medicine to help you relax (sedative). Take over-the-counter and prescription medicines only as told by your health care provider. Avoid  putting pressure on the area where the cardiac device was placed. If you need an MRI after your cardiac device has been placed, be sure to tell the health care provider who orders the MRI that you have a cardiac device. Avoid close and prolonged exposure to electrical devices that have strong magnetic fields. These include: Cell phones. Avoid keeping them in a pocket near the cardiac device, and try using the ear opposite the cardiac  device. MP3 players. Household appliances, like microwaves. Metal detectors. Electric generators. High-tension wires. Keep all follow-up visits as directed by your health care provider. This is important. Contact a health care provider if: You have pain at the incision site that is not relieved by over-the-counter or prescription medicines. You have any of these around your incision site or coming from it: More redness, swelling, or pain. Fluid or blood. Warmth to the touch. Pus or a bad smell. You have a fever. You feel brief, occasional palpitations, light-headedness, or any symptoms that you think might be related to your heart. Get help right away if: You experience chest pain that is different from the pain at the cardiac device site. You develop a red streak that extends above or below the incision site. You experience shortness of breath. You have palpitations or an irregular heartbeat. You have light-headedness that does not go away quickly. You faint or have dizzy spells. Your pulse suddenly drops or increases rapidly and does not return to normal. You begin to gain weight and your legs and ankles swell. Summary After your procedure, it is common to have pain, soreness, and some swelling where the cardiac device was inserted. Make sure to keep your incision clean and dry. Follow instructions from your health care provider about how to take care of your incision. Check your incision every day for signs of infection, such as more pain or swelling, pus or a bad smell, warmth, or leaking fluid and blood. Avoid strenuous exercise and lifting your left arm higher than your shoulder for 2 weeks, or as long as told by your health care provider. This information is not intended to replace advice given to you by your health care provider. Make sure you discuss any questions you have with your health care provider.

## 2022-06-17 NOTE — Interval H&P Note (Signed)
History and Physical Interval Note:  06/17/2022 1:11 PM  Alan [name]  has presented today for surgery, with the diagnosis of snd.  The various methods of treatment have been discussed with the patient and family. After consideration of risks, benefits and other options for treatment, the patient has consented to  Procedure(s): PPM GENERATOR CHANGEOUT (N/A) as a surgical intervention.  The patient's history has been reviewed, patient examined, no change in status, stable for surgery.  I have reviewed the patient's chart and labs.  Questions were answered to the patient's satisfaction.     Sherryl Manges

## 2022-06-18 ENCOUNTER — Encounter (HOSPITAL_COMMUNITY): Payer: Self-pay | Admitting: Internal Medicine

## 2022-06-30 NOTE — Patient Instructions (Signed)
   After Your Pacemaker   Monitor your pacemaker site for redness, swelling, and drainage. Call the device clinic at [phone] if you experience these symptoms or fever/chills.  Your incision was closed with Dermabond:  You may shower 1 day after your defibrillator implant and wash your incision with soap and water. Avoid lotions, ointments, or perfumes over your incision until it is well-healed.  You may use a hot tub or a pool after your wound check appointment if the incision is completely closed.  There are no other restrictions in arm movement after your wound check appointment.  You may drive, unless driving has been restricted by your healthcare providers.  Remote monitoring is used to monitor your pacemaker from home. This monitoring is scheduled every 91 days by our office. It allows [place] to keep an eye on the functioning of your device to ensure it is working properly. You will routinely see your Electrophysiologist annually (more often if necessary).

## 2022-06-30 NOTE — Progress Notes (Signed)
Remote pacemaker transmission.   

## 2022-07-01 ENCOUNTER — Ambulatory Visit: Payer: Medicare Other | Attending: Cardiovascular Disease

## 2022-07-01 DIAGNOSIS — I495 Sick sinus syndrome: Secondary | ICD-10-CM | POA: Diagnosis not present

## 2022-07-01 LAB — CUP PACEART INCLINIC DEVICE CHECK
Brady Statistic RA Percent Paced: 69 %
Brady Statistic RV Percent Paced: 1 % — CL
Date Time Interrogation Session: 20240522203517
Implantable Lead Connection Status: 753985
Implantable Lead Connection Status: 753985
Implantable Lead Implant Date: 20150506
Implantable Lead Implant Date: 20150506
Implantable Lead Location: 753859
Implantable Lead Location: 753860
Implantable Lead Model: 4086
Implantable Lead Model: 4087
Implantable Lead Serial Number: 265257
Implantable Lead Serial Number: 310823
Implantable Pulse Generator Implant Date: 20240508
Lead Channel Impedance Value: 463 Ohm
Lead Channel Impedance Value: 970 Ohm
Lead Channel Pacing Threshold Amplitude: 0.7 V
Lead Channel Pacing Threshold Amplitude: 0.8 V
Lead Channel Pacing Threshold Pulse Width: 0.4 ms
Lead Channel Pacing Threshold Pulse Width: 0.4 ms
Lead Channel Sensing Intrinsic Amplitude: 15.3 mV
Lead Channel Sensing Intrinsic Amplitude: 4.8 mV
Lead Channel Setting Pacing Amplitude: 2 V
Lead Channel Setting Pacing Amplitude: 2.4 V
Lead Channel Setting Pacing Pulse Width: 0.4 ms
Lead Channel Setting Sensing Sensitivity: 2.5 mV
Pulse Gen Serial Number: 776986
Zone Setting Status: 755011

## 2022-07-01 NOTE — Progress Notes (Signed)
Wound check appointment. Steri-strips removed. Wound without redness or edema. Incision edges approximated, wound well healed. Normal device function. Thresholds, sensing, and impedances consistent with implant measurements. Device programmed with appropriate safety margins for chronic leads. Histogram distribution appropriate for patient and level of activity. 2 SVT episodes, longest is 14 secs, no ventricular arrhythmias noted, ventricular rates controlled. Patient educated about wound care.  No further arm restrictions due to gen change only procedure. ROV in 3 months with implanting physician.

## 2022-07-07 ENCOUNTER — Ambulatory Visit: Payer: Medicare Other

## 2022-08-07 ENCOUNTER — Ambulatory Visit: Payer: Medicare Other

## 2022-08-19 ENCOUNTER — Telehealth: Payer: Self-pay

## 2022-08-19 NOTE — Telephone Encounter (Signed)
Following alert received from [employer] received for Device alert VT Episode occurred (V>A). 5 EGM's show short V to A, longest duration 54sec, HR's 162-176. Events occurred 7/9 18:43-19:01, not a new finding, route for duration, increased RR per trends. Presenting regular AS/VS, ~75 bpm.  Spoke to patients wife Alan [name], [place]), states patient has been seen twice recently for a cough and taking abx. States patient has dementia and unable to recall any symptoms. States she lives 2 miles from patient and helps him daily with medications. Reports patient did experience fall last night d/t tripped around midnight (remote transmission was sent at 01:01 AM) and no alerts noted during that time. Unable to correlate fall with episode. Reports only injury was to his elbow in which she went over and wrapped with a dressing. Advised to f/u with PCP about fall. 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 This is not a new finding for patient and will continue to monitor. Advised if any questions or concerns arise please call the device clinic at [phone]. Voiced understanding and appreciative of call.

## 2022-08-20 ENCOUNTER — Telehealth: Payer: Self-pay

## 2022-08-20 MED ORDER — METOPROLOL SUCCINATE ER 50 MG PO TB24: 50.0000 mg | ORAL_TABLET | Freq: Every day | ORAL | 3 refills | Status: DC

## 2022-08-20 NOTE — Telephone Encounter (Signed)
Discussed with Dr. Graciela Husbands.  Will increase metoprolol succinate to 50 mg PO daily and continue to monitor remotely.  Call placed to Pt's wife.  Advised of med change.  She is in agreement.  She requests new prescription.  Prescription sent.  Will continue to monitor.

## 2022-08-20 NOTE — Telephone Encounter (Addendum)
Alert received from [employer]:  Alert remote transmission: There were 22 NSVT arrhythmias detected since 07/01/2022, the EGM  from 08/19/2022 shows possible AVNRT that was triggered with a PAC and was 183 bpm and lasted 33 minutes and 35 seconds.  Sent to triage.  Pt has a history of SVT.  Call received from Pt's wife.  Per wife during this episode she took Pt to [facility] ER.  Pt received adenosine IV x 1 and converted to SR. Have requested records from ER visit to discuss with Dr. Graciela Husbands.  Advised wife would return her call this afternoon.

## 2022-09-18 ENCOUNTER — Ambulatory Visit (INDEPENDENT_AMBULATORY_CARE_PROVIDER_SITE_OTHER): Payer: Medicare Other

## 2022-09-18 DIAGNOSIS — I495 Sick sinus syndrome: Secondary | ICD-10-CM | POA: Diagnosis not present

## 2022-09-18 LAB — CUP PACEART REMOTE DEVICE CHECK
Battery Remaining Longevity: 102 mo
Battery Remaining Percentage: 100 %
Brady Statistic RA Percent Paced: 76 %
Brady Statistic RV Percent Paced: 0 %
Date Time Interrogation Session: 20240809012700
Implantable Lead Connection Status: 753985
Implantable Lead Connection Status: 753985
Implantable Lead Implant Date: 20150506
Implantable Lead Implant Date: 20150506
Implantable Lead Location: 753859
Implantable Lead Location: 753860
Implantable Lead Model: 4086
Implantable Lead Model: 4087
Implantable Lead Serial Number: 265257
Implantable Lead Serial Number: 310823
Implantable Pulse Generator Implant Date: 20240508
Lead Channel Impedance Value: 1015 Ohm
Lead Channel Impedance Value: 468 Ohm
Lead Channel Pacing Threshold Amplitude: 0.6 V
Lead Channel Pacing Threshold Amplitude: 0.7 V
Lead Channel Pacing Threshold Pulse Width: 0.4 ms
Lead Channel Pacing Threshold Pulse Width: 0.4 ms
Lead Channel Setting Pacing Amplitude: 2 V
Lead Channel Setting Pacing Amplitude: 2.4 V
Lead Channel Setting Pacing Pulse Width: 0.4 ms
Lead Channel Setting Sensing Sensitivity: 2.5 mV
Pulse Gen Serial Number: 776986
Zone Setting Status: 755011

## 2022-09-23 ENCOUNTER — Telehealth: Payer: Self-pay

## 2022-09-23 NOTE — Telephone Encounter (Signed)
Spoke w/ patients wife. Pt reports near syncope as sole symptom. Near syncope coorelates w/ episode of AVNRT on 8/13 @ 2102. Cannot rule out orthostatic hypotension due to being historically asymptomatic with similar episodes. Pt was in bathroom, bent down to pick something up, and upon standing felt like he was going to pass out. Confirmed patient is taking 50mg  metop XL. Confirmed next appointment w/ patients wife. Given ED precautions. Advised if any questions or concerns arise please call the device clinic at [phone]. Voiced understanding and appreciative of call.

## 2022-09-23 NOTE — Telephone Encounter (Signed)
Device alert VT Episode occurred (V>A). 2 episodes 8/13 @ 21:02 and 21:08,  longest duration 14sec, HR's 165-182, EGM's show short V to A Metoprolol increased 7/11 per PA report 1 NSVT, and 1 ATR showing atrial driven 1:1, 2-14sec in duration Route to triage for ongoing events after increase in BB LA, CVRS   LMTCB to assess for symptoms and confirm meds.

## 2022-09-24 ENCOUNTER — Telehealth: Payer: Self-pay

## 2022-09-24 NOTE — Telephone Encounter (Signed)
Reviewed w/ SK. Unable to determine what caused near syncope relative to whether orthostatic hypotension or the arrhythmia induced the symptoms. Will continue to monitor.

## 2022-09-24 NOTE — Telephone Encounter (Signed)
Alert remote transmission: VT episode, V>A, There were several episodes detected, this is not a new finding ? AVNRT, see episode V-43, sent to triage.         Follow up as scheduled.  Hassell Halim, RN, CCDS, [employer]  Continued episodes of AVNRT.

## 2022-09-28 ENCOUNTER — Telehealth: Payer: Self-pay

## 2022-09-28 NOTE — Telephone Encounter (Addendum)
Device alert VT Episode occurred (V>A). 13 new events, longest duration 8sec, EGM's short V to A ?AVNRT episodes have continued intermittently since 09/22/22 sx event.    Longest episode occurred on 09/26/22 at approx 8pm. Wife states that patient called her to room when feeling his heart racing and near syncope. He did not pass out but "was close". No changes to diet, health or meds, and has not missed any doses.  Patient has dual chamber BSX PPM implanted for SSS, programmed: DDDR 70/110.   Forwarding for Dr. Graciela Husbands to review.  Appt with Dr. Graciela Husbands 10/22/22.   LONGEST 19 MINUTE ?AVNRT episode: Patient symptomatic, near syncope/palpitations.              PRESENTING:

## 2022-09-28 NOTE — Telephone Encounter (Signed)
Discussed with Dr. Graciela Husbands. Need to consider starting on Amiodarone.  ALanna Poche, PA-C has opening tomorrow (8/20) at 12noon, patient scheduled. Patient/wife have concerns with Amio and may want to consider if other alternatives.    Forwarding to A. Tillery PA-C.

## 2022-09-29 ENCOUNTER — Ambulatory Visit: Payer: Medicare Other | Attending: Student | Admitting: Student

## 2022-09-29 ENCOUNTER — Encounter: Payer: Self-pay | Admitting: Student

## 2022-09-29 VITALS — BP 118/76 | HR 70 | Ht 67.0 in | Wt 149.0 lb

## 2022-09-29 DIAGNOSIS — Z950 Presence of cardiac pacemaker: Secondary | ICD-10-CM | POA: Diagnosis present

## 2022-09-29 DIAGNOSIS — I495 Sick sinus syndrome: Secondary | ICD-10-CM | POA: Insufficient documentation

## 2022-09-29 DIAGNOSIS — I471 Supraventricular tachycardia, unspecified: Secondary | ICD-10-CM | POA: Insufficient documentation

## 2022-09-29 MED ORDER — AMIODARONE HCL 200 MG PO TABS: ORAL_TABLET | ORAL | 0 refills | Status: DC

## 2022-09-29 MED ORDER — AMIODARONE HCL 200 MG PO TABS: ORAL_TABLET | ORAL | 0 refills | Status: AC

## 2022-09-29 NOTE — Progress Notes (Signed)
  Electrophysiology Office Note:   ID:  Alan [name], DOB 10-12-1931, MRN [id number]  Primary Cardiologist: Sherryl Manges, MD Electrophysiologist: Sherryl Manges, MD      History of Present Illness:   Alan [name] is a 89 y.o. male with h/o prostate CA (treated surgically), HTN, anxiety/depression, dementia, SVT and symptomatic bradycardia s/p PPM seen today for acute visit due to HVRs.    Patient reports tachy-palpitations and dizziness at times. He initially couldn't remember symptoms, but his wife states she has called him at times with complaints of heart racing. Denies syncope or edema. He has previously been offered amiodarone but NEVER started it due to reading the possible side effects.  This did require some clarification as they initially attributed a history of scrotal edema tho this medication.   Review of systems complete and found to be negative unless listed in HPI.   EP Information / Studies Reviewed:    EKG is not ordered today. EKG from 08/20/2022 reviewed which showed NSR at 98 bpm  PPM Interrogation-  reviewed in detail today,  See PACEART report.  Device History: Field seismologist PPM implanted 06/2013, gen change 06/2022 for Tachy-Brady syndrome   Physical Exam:   VS:  BP 118/76   Pulse 70   Ht 5\' 7"  (1.702 m)   Wt 149 lb (67.6 kg)   SpO2 96%   BMI 23.34 kg/m    Wt Readings from Last 3 Encounters:  09/29/22 149 lb (67.6 kg)  06/17/22 157 lb (71.2 kg)  06/03/22 157 lb 3.2 oz (71.3 kg)     GEN: Elderly appearing male, well developed in no acute distress NECK: No JVD; No carotid bruits CARDIAC: Regular rate and rhythm, no murmurs, rubs, gallops RESPIRATORY:  Clear to auscultation without rales, wheezing or rhonchi  ABDOMEN: Soft, non-tender, non-distended EXTREMITIES:  No edema; No deformity   ASSESSMENT AND PLAN:    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 Highly suggestive of AVNRT, though cannot rule out other mechanisms of SVT.  Continue Toprol 50 mg daily, can decrease if needed now adding amiodarone. Add amiodarone 200 mg BID x 7 days then 200 mg daily. Long discussion about side effect profile and they are willing to try.  He will keep an appointment in 1 month with Dr. Graciela Husbands to see how he is feeling.  Could also try Multaq if he has off target effects from amiodarone.   HTN Stable on current regimen   Disposition:   Follow up with Dr. Graciela Husbands in 4 weeks as scheduled  Signed, Graciella Freer, PA-C

## 2022-09-29 NOTE — Patient Instructions (Signed)
Medication Instructions:  1.Start amiodarone 200 mg twice daily for one week, then 200 mg daily thereafter *If you need a refill on your cardiac medications before your next appointment, please call your pharmacy*  Lab Work: CMET, TSH, FreeT4-TODAY If you have labs (blood work) drawn today and your tests are completely normal, you will receive your results only by: MyChart Message (if you have MyChart) OR A paper copy in the mail If you have any lab test that is abnormal or we need to change your treatment, we will call you to review the results.  Follow-Up: At [facility], you and your health needs are our priority.  As part of our continuing mission to provide you with exceptional heart care, we have created designated Provider Care Teams.  These Care Teams include your primary Cardiologist (physician) and Advanced Practice Providers (APPs -  Physician Assistants and Nurse Practitioners) who all work together to provide you with the care you need, when you need it.  We recommend signing up for the patient portal called "MyChart".  Sign up information is provided on this After Visit Summary.  MyChart is used to connect with patients for Virtual Visits (Telemedicine).  Patients are able to view lab/test results, encounter notes, upcoming appointments, etc.  Non-urgent messages can be sent to your provider as well.   To learn more about what you can do with MyChart, go to [email or web address].    Your next appointment:   10/22/22 at 3:30 PM  Provider:   Sherryl Manges, MD

## 2022-09-30 LAB — COMPREHENSIVE METABOLIC PANEL
ALT: 27 IU/L (ref 0–44)
AST: 35 IU/L (ref 0–40)
Albumin: 4.6 g/dL (ref 3.7–4.7)
Alkaline Phosphatase: 112 IU/L (ref 44–121)
BUN/Creatinine Ratio: 17 (ref 10–24)
BUN: 19 mg/dL (ref 8–27)
Bilirubin Total: 0.4 mg/dL (ref 0.0–1.2)
CO2: 22 mmol/L (ref 20–29)
Calcium: 9.8 mg/dL (ref 8.6–10.2)
Chloride: 99 mmol/L (ref 96–106)
Creatinine, Ser: 1.13 mg/dL (ref 0.76–1.27)
Globulin, Total: 2.4 g/dL (ref 1.5–4.5)
Glucose: 101 mg/dL — ABNORMAL HIGH (ref 70–99)
Potassium: 4.4 mmol/L (ref 3.5–5.2)
Sodium: 143 mmol/L (ref 134–144)
Total Protein: 7 g/dL (ref 6.0–8.5)
eGFR: 62 mL/min/{1.73_m2} (ref >59)

## 2022-09-30 LAB — T4, FREE: Free T4: 1.22 ng/dL (ref 0.82–1.77)

## 2022-09-30 LAB — TSH: TSH: 1.76 u[IU]/mL (ref 0.450–4.500)

## 2022-10-02 NOTE — Progress Notes (Signed)
Remote pacemaker transmission.   

## 2022-10-22 ENCOUNTER — Inpatient Hospital Stay (HOSPITAL_COMMUNITY)
Admission: EM | Admit: 2022-10-22 | Discharge: 2022-10-24 | DRG: 068 | Disposition: A | Discharge status: Home Health | Payer: Medicare Other | Attending: Family Medicine | Admitting: Family Medicine

## 2022-10-22 ENCOUNTER — Emergency Department (HOSPITAL_COMMUNITY): Payer: Medicare Other

## 2022-10-22 ENCOUNTER — Ambulatory Visit: Payer: Medicare Other | Admitting: Internal Medicine

## 2022-10-22 ENCOUNTER — Encounter (HOSPITAL_COMMUNITY): Payer: Self-pay

## 2022-10-22 ENCOUNTER — Other Ambulatory Visit: Payer: Self-pay

## 2022-10-22 ENCOUNTER — Telehealth: Payer: Self-pay | Admitting: Pharmacist

## 2022-10-22 DIAGNOSIS — Z9079 Acquired absence of other genital organ(s): Secondary | ICD-10-CM | POA: Diagnosis not present

## 2022-10-22 DIAGNOSIS — W010XXA Fall on same level from slipping, tripping and stumbling without subsequent striking against object, initial encounter: Secondary | ICD-10-CM | POA: Diagnosis present

## 2022-10-22 DIAGNOSIS — S01311A Laceration without foreign body of right ear, initial encounter: Secondary | ICD-10-CM | POA: Diagnosis present

## 2022-10-22 DIAGNOSIS — Z950 Presence of cardiac pacemaker: Secondary | ICD-10-CM

## 2022-10-22 DIAGNOSIS — I10 Essential (primary) hypertension: Secondary | ICD-10-CM | POA: Diagnosis present

## 2022-10-22 DIAGNOSIS — S0003XA Contusion of scalp, initial encounter: Secondary | ICD-10-CM | POA: Diagnosis present

## 2022-10-22 DIAGNOSIS — R32 Unspecified urinary incontinence: Secondary | ICD-10-CM | POA: Diagnosis present

## 2022-10-22 DIAGNOSIS — I471 Supraventricular tachycardia, unspecified: Secondary | ICD-10-CM | POA: Diagnosis present

## 2022-10-22 DIAGNOSIS — I6629 Occlusion and stenosis of unspecified posterior cerebral artery: Secondary | ICD-10-CM | POA: Diagnosis present

## 2022-10-22 DIAGNOSIS — Z79899 Other long term (current) drug therapy: Secondary | ICD-10-CM

## 2022-10-22 DIAGNOSIS — R1310 Dysphagia, unspecified: Secondary | ICD-10-CM | POA: Diagnosis present

## 2022-10-22 DIAGNOSIS — F419 Anxiety disorder, unspecified: Secondary | ICD-10-CM | POA: Diagnosis present

## 2022-10-22 DIAGNOSIS — G4733 Obstructive sleep apnea (adult) (pediatric): Secondary | ICD-10-CM | POA: Diagnosis present

## 2022-10-22 DIAGNOSIS — E119 Type 2 diabetes mellitus without complications: Secondary | ICD-10-CM | POA: Diagnosis present

## 2022-10-22 DIAGNOSIS — D649 Anemia, unspecified: Secondary | ICD-10-CM | POA: Diagnosis present

## 2022-10-22 DIAGNOSIS — I459 Conduction disorder, unspecified: Secondary | ICD-10-CM | POA: Diagnosis present

## 2022-10-22 DIAGNOSIS — E785 Hyperlipidemia, unspecified: Secondary | ICD-10-CM | POA: Diagnosis present

## 2022-10-22 DIAGNOSIS — Z888 Allergy status to other drugs, medicaments and biological substances status: Secondary | ICD-10-CM

## 2022-10-22 DIAGNOSIS — Z8711 Personal history of peptic ulcer disease: Secondary | ICD-10-CM

## 2022-10-22 DIAGNOSIS — W1830XA Fall on same level, unspecified, initial encounter: Secondary | ICD-10-CM | POA: Insufficient documentation

## 2022-10-22 DIAGNOSIS — Z8546 Personal history of malignant neoplasm of prostate: Secondary | ICD-10-CM | POA: Diagnosis not present

## 2022-10-22 DIAGNOSIS — I669 Occlusion and stenosis of unspecified cerebral artery: Secondary | ICD-10-CM | POA: Insufficient documentation

## 2022-10-22 DIAGNOSIS — G309 Alzheimer's disease, unspecified: Secondary | ICD-10-CM | POA: Diagnosis present

## 2022-10-22 DIAGNOSIS — Z823 Family history of stroke: Secondary | ICD-10-CM

## 2022-10-22 DIAGNOSIS — R296 Repeated falls: Secondary | ICD-10-CM | POA: Diagnosis present

## 2022-10-22 DIAGNOSIS — F0283 Dementia in other diseases classified elsewhere, unspecified severity, with mood disturbance: Secondary | ICD-10-CM | POA: Diagnosis present

## 2022-10-22 DIAGNOSIS — H539 Unspecified visual disturbance: Principal | ICD-10-CM

## 2022-10-22 DIAGNOSIS — I495 Sick sinus syndrome: Secondary | ICD-10-CM

## 2022-10-22 DIAGNOSIS — Z882 Allergy status to sulfonamides status: Secondary | ICD-10-CM

## 2022-10-22 DIAGNOSIS — R2990 Unspecified symptoms and signs involving the nervous system: Secondary | ICD-10-CM | POA: Diagnosis present

## 2022-10-22 DIAGNOSIS — R262 Difficulty in walking, not elsewhere classified: Secondary | ICD-10-CM | POA: Diagnosis present

## 2022-10-22 DIAGNOSIS — R251 Tremor, unspecified: Secondary | ICD-10-CM | POA: Diagnosis present

## 2022-10-22 DIAGNOSIS — I4891 Unspecified atrial fibrillation: Secondary | ICD-10-CM | POA: Diagnosis present

## 2022-10-22 DIAGNOSIS — R29702 NIHSS score 2: Secondary | ICD-10-CM | POA: Diagnosis present

## 2022-10-22 DIAGNOSIS — Z85828 Personal history of other malignant neoplasm of skin: Secondary | ICD-10-CM

## 2022-10-22 DIAGNOSIS — R001 Bradycardia, unspecified: Secondary | ICD-10-CM | POA: Diagnosis present

## 2022-10-22 DIAGNOSIS — Z8249 Family history of ischemic heart disease and other diseases of the circulatory system: Secondary | ICD-10-CM | POA: Diagnosis not present

## 2022-10-22 LAB — COMPREHENSIVE METABOLIC PANEL
ALT: 19 U/L (ref 0–44)
AST: 30 U/L (ref 15–41)
Albumin: 3.4 g/dL — ABNORMAL LOW (ref 3.5–5.0)
Alkaline Phosphatase: 73 U/L (ref 38–126)
Anion gap: 11 (ref 5–15)
BUN: 15 mg/dL (ref 8–23)
CO2: 25 mmol/L (ref 22–32)
Calcium: 8.7 mg/dL — ABNORMAL LOW (ref 8.9–10.3)
Chloride: 100 mmol/L (ref 98–111)
Creatinine, Ser: 1.09 mg/dL (ref 0.61–1.24)
GFR, Estimated: >60 mL/min (ref >60)
Glucose, Bld: 105 mg/dL — ABNORMAL HIGH (ref 70–99)
Potassium: 4 mmol/L (ref 3.5–5.1)
Sodium: 136 mmol/L (ref 135–145)
Total Bilirubin: 0.7 mg/dL (ref 0.3–1.2)
Total Protein: 5.9 g/dL — ABNORMAL LOW (ref 6.5–8.1)

## 2022-10-22 LAB — CBC WITH DIFFERENTIAL/PLATELET
Abs Immature Granulocytes: 0.02 10*3/uL (ref 0.00–0.07)
Basophils Absolute: 0.1 10*3/uL (ref 0.0–0.1)
Basophils Relative: 1 %
Eosinophils Absolute: 0.1 10*3/uL (ref 0.0–0.5)
Eosinophils Relative: 2 %
HCT: 37.2 % — ABNORMAL LOW (ref 39.0–52.0)
Hemoglobin: 12 g/dL — ABNORMAL LOW (ref 13.0–17.0)
Immature Granulocytes: 0 %
Lymphocytes Relative: 10 %
Lymphs Abs: 0.7 10*3/uL (ref 0.7–4.0)
MCH: 31.3 pg (ref 26.0–34.0)
MCHC: 32.3 g/dL (ref 30.0–36.0)
MCV: 97.1 fL (ref 80.0–100.0)
Monocytes Absolute: 0.6 10*3/uL (ref 0.1–1.0)
Monocytes Relative: 9 %
Neutro Abs: 5.4 10*3/uL (ref 1.7–7.7)
Neutrophils Relative %: 78 %
Platelets: 199 10*3/uL (ref 150–400)
RBC: 3.83 MIL/uL — ABNORMAL LOW (ref 4.22–5.81)
RDW: 14.2 % (ref 11.5–15.5)
WBC: 6.9 10*3/uL (ref 4.0–10.5)
nRBC: 0 % (ref 0.0–0.2)

## 2022-10-22 MED ORDER — ROSUVASTATIN CALCIUM 20 MG PO TABS
20.0000 mg | ORAL_TABLET | Freq: Every day | ORAL | Status: DC
  Administered 2022-10-23: 20 mg via ORAL
  Filled 2022-10-22: qty 1

## 2022-10-22 MED ORDER — MIRTAZAPINE 15 MG PO TABS
30.0000 mg | ORAL_TABLET | Freq: Every day | ORAL | Status: DC
  Administered 2022-10-23: 30 mg via ORAL
  Filled 2022-10-22: qty 2

## 2022-10-22 MED ORDER — DONEPEZIL HCL 10 MG PO TABS
10.0000 mg | ORAL_TABLET | Freq: Every day | ORAL | Status: DC
  Administered 2022-10-23 – 2022-10-24 (×2): 10 mg via ORAL
  Filled 2022-10-22 (×2): qty 1

## 2022-10-22 MED ORDER — IOHEXOL 350 MG/ML SOLN
75.0000 mL | Freq: Once | INTRAVENOUS | Status: AC | PRN
  Administered 2022-10-22: 75 mL via INTRAVENOUS

## 2022-10-22 MED ORDER — ENOXAPARIN SODIUM 40 MG/0.4ML IJ SOSY
40.0000 mg | PREFILLED_SYRINGE | INTRAMUSCULAR | Status: DC
  Administered 2022-10-23 – 2022-10-24 (×2): 40 mg via SUBCUTANEOUS
  Filled 2022-10-22 (×2): qty 0.4

## 2022-10-22 MED ORDER — AMIODARONE HCL 200 MG PO TABS
200.0000 mg | ORAL_TABLET | Freq: Every day | ORAL | Status: DC
  Administered 2022-10-23 – 2022-10-24 (×2): 200 mg via ORAL
  Filled 2022-10-22 (×2): qty 1

## 2022-10-22 NOTE — Consult Note (Signed)
NEUROLOGY CONSULTATION NOTE   Date of service: October 22, 2022 Patient Name: Alan [name] MRN:  [id number] DOB:  1931/04/06 Reason for consult: "Mechanical fall and vision problems" Requesting Provider: Dolly Rias, MD _ _ _   _ __   _ __ _ _  __ __   _ __   __ _  History of Present Illness  Alan [name] is a 89 y.o. male with PMH significant for alzheimers type dementia, DM2, HTN, PUD, who presents with fall this AM, episode of tremulousness, blurred vision in the afternoon.  Patient was up at 5 this AM. Walked to the kitchen and unclear what happened but thinks he slipped and fell backwards and his his head. Called his wife who lives 2 miles down the road. She came in and he was walking fine. This afternoon, they came in for follow up for his pacemaker. At the doctor's office, he was up getting his weight checked and felt tremulous and shaky. He had earlier reported that his vision was blurry.  EMS called and he was brought in to the ED where [facility] and CTA with no hemorrhage, no large stroke. CTA notable for R PCA P3-4 short segment occlusion.  He is being observed for PT and OT and for several fall over the last few weeks. Neurology consulted for further workup and evaluation of episode today.  LKW: 0530AM mRS: 2 tNKASE: not offered, no focal deficit. Thrombectomy: not offered, no focal deficit. NIHSS components Score: Comment  1a Level of Conscious 0[x]  1[]  2[]  3[]      1b LOC Questions 0[]  1[]  2[x]       1c LOC Commands 0[x]  1[]  2[]       2 Best Gaze 0[x]  1[]  2[]       3 Visual 0[x]  1[]  2[]  3[]      4 Facial Palsy 0[x]  1[]  2[]  3[]      5a Motor Arm - left 0[x]  1[]  2[]  3[]  4[]  UN[]    5b Motor Arm - Right 0[x]  1[]  2[]  3[]  4[]  UN[]    6a Motor Leg - Left 0[x]  1[]  2[]  3[]  4[]  UN[]    6b Motor Leg - Right 0[x]  1[]  2[]  3[]  4[]  UN[]    7 Limb Ataxia 0[x]  1[]  2[]  3[]  UN[]     8 Sensory 0[x]  1[]  2[]  UN[]      9 Best Language 0[x]  1[]  2[]  3[]      10 Dysarthria 0[x]  1[]  2[]  UN[]      11  Extinct. and Inattention 0[x]  1[]  2[]       TOTAL: 2     ROS   Constitutional Denies weight loss, fever and chills.   HEENT Denies changes in vision and hearing.   Respiratory Denies SOB and cough.   CV Denies palpitations and CP   GI Denies abdominal pain, nausea, vomiting and diarrhea.   GU Denies dysuria and urinary frequency.   MSK Denies myalgia and joint pain.   Skin Denies rash and pruritus.   Neurological Denies headache and syncope.   Psychiatric Denies recent changes in mood. Denies anxiety and depression.    Past History   Past Medical History:  Diagnosis Date   Anemia    Anxiety 07/19/2008   Qualifier: Diagnosis of  By: Wyn Quaker CMA, Marchelle Folks     Arthritis    Bradycardia    chronotropic incompetence   Carotid stenosis    <60% 2012   Diabetes mellitus    Borderline   Diverticulosis    Esophageal stricture    Exercise intolerance  Hemorrhoids    Hernia    Hypertension    Lightheadedness 10/14/2010   Peptic ulcer disease    Prostate cancer (HCC)    Vitamin B 12 deficiency    Past Surgical History:  Procedure Laterality Date   PPM GENERATOR CHANGEOUT N/A 06/17/2022   Procedure: PPM GENERATOR CHANGEOUT;  Surgeon: Duke Salvia, MD;  Location: [facility] INVASIVE CV LAB;  Service: Cardiovascular;  Laterality: N/A;   RETROPUBIC PROSTATECTOMY  2005   Radical   SKIN SURGERY     basal cell carcinoma   Family History  Problem Relation Age of Onset   Heart disease Mother    Stroke Mother    Cancer Father        prostate   Stroke Father    Cancer Paternal Grandfather        prostate   Social History   Socioeconomic History   Marital status: Married    Spouse name: Not on file   Number of children: 1   Years of education: Not on file   Highest education level: Not on file  Occupational History   Occupation: Retired    Comment: Art gallery manager  Tobacco Use   Smoking status: Never   Smokeless tobacco: Never  Vaping Use   Vaping status: Never Used  Substance and  Sexual Activity   Alcohol use: No   Drug use: No   Sexual activity: Not on file  Other Topics Concern   Not on file  Social History Narrative   Married, 1 son   Right handed   BS    No caffeine   Social Determinants of Health   Financial Resource Strain: Not on file  Food Insecurity: Not on file  Transportation Needs: Not on file  Physical Activity: Not on file  Stress: Not on file  Social Connections: Not on file   Allergies  Allergen Reactions   Cefdinir Other (See Comments)    confusion   Effexor [Venlafaxine] Other (See Comments)    "extremely dizzy"   Sulfa Antibiotics Other (See Comments) and Swelling   Amlodipine Besylate Swelling    Swelling of scrotum    Aliskiren Other (See Comments)    Lower back pain   Sulfamethoxazole     throat swelled   Tekturna Hct [Aliskiren-Hydrochlorothiazide]     Lower back pain    Medications  (Not in a hospital admission)    Vitals   Vitals:   10/22/22 2045 10/22/22 2100 10/22/22 2115 10/22/22 2145  BP: (!) 142/77 (!) 183/90 138/86 136/76  Pulse: 70 72 71 71  Resp: 14 16 16 18   Temp:      TempSrc:      SpO2: 97% 99% 94% 94%     There is no height or weight on file to calculate BMI.  Physical Exam   General: Laying comfortably in bed; in no acute distress.  HENT: Normal oropharynx and mucosa. Normal external appearance of ears and nose.  Neck: Supple, no pain or tenderness  CV: No JVD. No peripheral edema.  Pulmonary: Symmetric Chest rise. Normal respiratory effort.  Abdomen: Soft to touch, non-tender.  Ext: No cyanosis, edema, or deformity  Skin: No rash. Normal palpation of skin.   Musculoskeletal: Normal digits and nails by inspection. No clubbing.   Neurologic Examination  Mental status/Cognition: Alert, oriented to self, place, but not to month and year(this is baseline from alzheimers per discussion with his wife) good attention. Speech/language: Fluent, comprehension intact, object naming intact,  repetition intact.  Cranial nerves:   CN II Pupils equal and reactive to light, no VF deficits    CN III,IV,VI EOM intact, no gaze preference or deviation, no nystagmus    CN V normal sensation in V1, V2, and V3 segments bilaterally    CN VII no asymmetry, no nasolabial fold flattening    CN VIII normal hearing to speech    CN IX & X normal palatal elevation, no uvular deviation    CN XI 5/5 head turn and 5/5 shoulder shrug bilaterally    CN XII midline tongue protrusion    Motor:  Muscle bulk: normal, tone normal, pronator drift none tremor none Mvmt Root Nerve  Muscle Right Left Comments  SA C5/6 Ax Deltoid 5 5   EF C5/6 Mc Biceps 5 5   EE C6/7/8 Rad Triceps 5 5   WF C6/7 Med FCR     WE C7/8 PIN ECU     F Ab C8/T1 U ADM/FDI 5 5   HF L1/2/3 Fem Illopsoas 5 5   KE L2/3/4 Fem Quad 5 5   DF L4/5 D Peron Tib Ant 5 5   PF S1/2 Tibial Grc/Sol 5 5    Sensation:  Light touch Intact throughout   Pin prick    Temperature    Vibration   Proprioception    Coordination/Complex Motor:  - Finger to Nose intact BL - Heel to shin intact BL - Rapid alternating movement are normal - Gait: deferred.  Labs   CBC:  Recent Labs  Lab 10/22/22 1819  WBC 6.9  NEUTROABS 5.4  HGB 12.0*  HCT 37.2*  MCV 97.1  PLT 199    Basic Metabolic Panel:  Lab Results  Component Value Date   NA 136 10/22/2022   K 4.0 10/22/2022   CO2 25 10/22/2022   GLUCOSE 105 (H) 10/22/2022   BUN 15 10/22/2022   CREATININE 1.09 10/22/2022   CALCIUM 8.7 (L) 10/22/2022   GFRNONAA >60 10/22/2022   GFRAA >60 03/05/2017   Lipid Panel:  Lab Results  Component Value Date   LDLCALC 111 10/01/2009   HgbA1c:  Lab Results  Component Value Date   HGBA1C 5.8 03/17/2010   Urine Drug Screen: No results found for: "LABOPIA", "COCAINSCRNUR", "LABBENZ", "AMPHETMU", "THCU", "LABBARB"  Alcohol Level No results found for: "ETH"  CT Head without contrast(Personally reviewed): CTH was negative for a large hypodensity  concerning for a large territory infarct or hyperdensity concerning for an ICH  CT angio Head and Neck with contrast(Personally reviewed): R PCA P3-4 short segment occlusion  MRI Brain: pending  Impression   ORHAN FINGAR is a 89 y.o. male with PMH significant for alzheimers type dementia, DM2, HTN, PUD, who presents with fall this AM, episode of tremulousness, blurred vision in the afternoon at cardiologist's office.  He is poor historian specially with his alzheimers. Episode could have been from a potential concussion from fall earlier in the day, alternatively with the noted short segment R PCA occlusion, could be a small stroke. Perhaps could have been hypotension or arhythmia specially with the noted trmulousness.  From a neuro standpoint, I think getting an MRI Brain w.o contrast would be helpful to rule out small stroke in the R PCA distribution. Also helpful would be interrogating his pacemaker to look for cardiac arhythmia as an alternate etiology of his presentation.  Recommendations  - MRI Brain w/o contrast. Will happen in AM only with his PPM. - Pacemaker interrogation. - Agree with PT and OT  with multiple falls. - can do a 1 time dose of Aspirin 81mg  for now. If the MRI shows an acute stroke, will need full stroke workup and continue aspirin. - If MRI brain is negative, no further inpatient neurologic workup and okay to follow up outpatient. ______________________________________________________________________   Thank you for the opportunity to take part in the care of this patient. If you have any further questions, please contact the neurology consultation attending.  Signed,  Erick Blinks [facility] _ _ _   _ __   _ __ _ _  __ __   _ __   __ _

## 2022-10-22 NOTE — ED Provider Notes (Signed)
[place] EMERGENCY DEPARTMENT AT [facility] Provider Note   CSN: [id number] Arrival date & time: 10/22/22  1715     History  Chief Complaint  Patient presents with   Visual Field Change    Alan [name] is a 89 y.o. male.  HPI  Patient is accompanied by his wife who is assisting in providing history.  She reports that this morning he had an unwitnessed ground-level fall in which he hit his head.  Initially presented to an outside ED where he required laceration repair to his right ear.  Head CT at that time was negative for intracranial bleeding.  He did not have further symptoms until they were driving to his outpatient cardiologist appointment and in the car he reported that he was unable to read the street signs which is atypical for him.  At the cardiologist he became very shaky and unable to walk therefore the cardiologist recommended that he come to the ED to be evaluated.    Home Medications Prior to Admission medications   Medication Sig Start Date End Date Taking? Authorizing Provider  alclomethasone (ACLOVATE) 0.05 % ointment Apply 1 application  topically daily. 10/05/22  Yes [provider]  ALPRAZolam Prudy Feeler) 0.25 MG tablet Take 0.25 mg by mouth at bedtime. May take another tablet by mouth if awakens during the night 10/24/14  Yes [provider]  amiodarone (PACERONE) 200 MG tablet Take one tablet by mouth twice daily for 1 weeks, then take one tablet by mouth once daily thereafter 09/29/22  Yes Graciella Freer, PA-C  b complex vitamins tablet Take 1 tablet by mouth daily with lunch.   Yes [provider]  Cholecalciferol (VITAMIN D) 50 MCG (2000 UT) tablet Take 2,000 Units by mouth daily in the afternoon.   Yes [provider]  CINNAMON PO Take 1 tablet by mouth 2 (two) times daily.   Yes [provider]  Coenzyme Q10 (COQ-10 PO) Take 1 tablet by mouth daily with supper.   Yes [provider]   CRESTOR 20 MG tablet Take 20 mg by mouth daily with supper. 09/20/14  Yes [provider]  Cyanocobalamin (B-12 PO) Take 1 capsule by mouth daily in the afternoon.   Yes [provider]  donepezil (ARICEPT) 10 MG tablet Take 10 mg by mouth daily.   Yes [provider]  furosemide (LASIX) 20 MG tablet Take 20 mg by mouth daily. 11/20/21  Yes [provider]  GARLIC PO Take 1 tablet by mouth daily.   Yes [provider]  loperamide (IMODIUM A-D) 2 MG tablet Take 2-4 mg by mouth 4 (four) times daily as needed for diarrhea or loose stools.   Yes [provider]  mirtazapine (REMERON) 30 MG tablet Take 30 mg by mouth at bedtime. 03/29/21  Yes [provider]  Omega-3 Fatty Acids (OMEGA 3 PO) Take 1 capsule by mouth daily.   Yes [provider]  vitamin C (ASCORBIC ACID) 500 MG tablet Take 500 mg by mouth 2 (two) times daily.   Yes [provider]  Xylometazoline HCl (OTRIVIN NA) Place 2 sprays into both nostrils daily as needed (nasal congestion). Nasal spray as needed twice daily   Yes [provider]  Zinc 25 MG TABS Take 25 mg by mouth daily in the afternoon.   Yes [provider]  Zinc Oxide (TRIPLE PASTE) 12.8 % ointment Apply 1 Application topically at bedtime as needed (when using mupirocin).   Yes  [provider]  Famotidine (PEPCID PO) Take 1 tablet by mouth daily as needed (pain). Patient not taking: Reported on 09/29/2022    [provider]      Allergies    Cefdinir, Effexor [venlafaxine], Sulfa antibiotics, Amlodipine besylate, Aliskiren, Sulfamethoxazole, and Tekturna hct [aliskiren-hydrochlorothiazide]    Review of Systems   Review of Systems  Physical Exam Updated Vital Signs BP 131/70 (BP Location: Left Arm)   Pulse 70   Temp 98.4 F (36.9 C) (Oral)   Resp 20   Ht 5\' 7"  (1.702 m)   Wt 68 kg   SpO2 96%   BMI 23.49 kg/m  Physical Exam Vitals and nursing note  reviewed.  Constitutional:      General: He is not in acute distress.    Appearance: He is well-developed.  HENT:     Head: Normocephalic and atraumatic.  Eyes:     Conjunctiva/sclera: Conjunctivae normal.  Cardiovascular:     Rate and Rhythm: Normal rate and regular rhythm.     Heart sounds: No murmur heard. Pulmonary:     Effort: Pulmonary effort is normal. No respiratory distress.     Breath sounds: Normal breath sounds.  Abdominal:     Palpations: Abdomen is soft.     Tenderness: There is no abdominal tenderness.  Musculoskeletal:        General: No swelling.     Cervical back: Neck supple.  Skin:    General: Skin is warm and dry.     Capillary Refill: Capillary refill takes less than 2 seconds.  Neurological:     Mental Status: He is alert.     GCS: GCS eye subscore is 4. GCS verbal subscore is 4. GCS motor subscore is 6.     Cranial Nerves: No dysarthria or facial asymmetry.     Sensory: Sensation is intact.     Motor: Motor function is intact.     Coordination: Finger-Nose-Finger Test normal.     Comments: Patient does have limited peripheral vision bilaterally, possibly worse in the left than the right.  Vision is otherwise grossly intact with his corrective lenses present.  Psychiatric:        Mood and Affect: Mood normal.     ED Results / Procedures / Treatments   Labs (all labs ordered are listed, but only abnormal results are displayed) Labs Reviewed  CBC WITH DIFFERENTIAL/PLATELET - Abnormal; Notable for the following components:      Result Value   RBC 3.83 (*)    Hemoglobin 12.0 (*)    HCT 37.2 (*)    All other components within normal limits  COMPREHENSIVE METABOLIC PANEL - Abnormal; Notable for the following components:   Glucose, Bld 105 (*)    Calcium 8.7 (*)    Total Protein 5.9 (*)    Albumin 3.4 (*)    All other components within normal limits  BASIC METABOLIC PANEL - Abnormal; Notable for the following components:   Calcium 8.7 (*)    All  other components within normal limits  CBC - Abnormal; Notable for the following components:   RBC 4.06 (*)    Hemoglobin 12.7 (*)    HCT 38.6 (*)    All other components within normal limits  LIPID PANEL - Abnormal; Notable for the following components:   Cholesterol 239 (*)    LDL Cholesterol 160 (*)    All other components within normal limits  HEMOGLOBIN A1C    EKG None  Radiology CT ANGIO HEAD  NECK W WO CM  Result Date: 10/22/2022 CLINICAL DATA:  Headache and visual field changes. EXAM: CT ANGIOGRAPHY HEAD AND NECK WITH AND WITHOUT CONTRAST TECHNIQUE: Multidetector CT imaging of the head and neck was performed using the standard protocol during bolus administration of intravenous contrast. Multiplanar CT image reconstructions and MIPs were obtained to evaluate the vascular anatomy. Carotid stenosis measurements (when applicable) are obtained utilizing NASCET criteria, using the distal internal carotid diameter as the denominator. RADIATION DOSE REDUCTION: This exam was performed according to the departmental dose-optimization program which includes automated exposure control, adjustment of the mA and/or kV according to patient size and/or use of iterative reconstruction technique. CONTRAST:  75mL OMNIPAQUE IOHEXOL 350 MG/ML SOLN COMPARISON:  Head CT 10/22/2022 FINDINGS: CT HEAD FINDINGS Brain: Mild volume loss. No mass, hemorrhage or extra-axial collection. Vascular: Carotid and vertebral artery calcifications at the skull base. Skull: No acute finding. Sinuses/Orbits: Paranasal sinuses are clear. No mastoid effusion. Normal orbits. Other: None Review of the MIP images confirms the above findings CTA NECK FINDINGS Aortic arch: Calcific aortic atherosclerosis. Normal 3 vessel branching pattern. Subclavian arteries are widely patent. Right carotid system: Moderate atherosclerotic calcification of the distal common carotid artery and carotid bifurcation resulting in approximately 40% stenosis at  the ICA origin. Mild atherosclerotic calcification of the distal ICA. Left carotid system: Moderate atherosclerosis at the carotid bifurcation causing approximately 50% stenosis at the ICA origin. Mild calcification in the distal ICA. Vertebral arteries: Left dominant system. Mild multifocal calcification. There is moderate stenosis of both vertebral artery origins. Skeleton: Negative. Other neck: Negative. Upper chest: Negative Review of the MIP images confirms the above findings CTA HEAD FINDINGS Anterior circulation: --Intracranial internal carotid arteries: Atherosclerotic calcification of both internal carotid arteries at the skull base with mild stenosis of the clinoid segments. --Anterior cerebral arteries (ACA): Normal. --Middle cerebral arteries (MCA): Normal. Posterior circulation: --Vertebral arteries: Bilateral atherosclerotic calcification without significant stenosis. --Inferior cerebellar arteries: Normal. --Basilar artery: Mild narrowing of the distal basilar artery --Superior cerebellar arteries: Normal. --Posterior cerebral arteries: Multifocal atherosclerotic irregularity of the right PCA with a short segment occlusion of the proximal P5 segment (series 7, image 96). There is also multifocal irregularity of the left PCA with multiple mild stenoses but no occlusion. Venous sinuses: As permitted by contrast timing, patent. Anatomic variants: None Review of the MIP images confirms the above findings IMPRESSION: 1. Short segment occlusion of the proximal P 5 segment of the right posterior cerebral artery. 2. Multifocal mild stenosis of the left PCA. 3. Bilateral carotid bifurcation atherosclerosis with approximately 40% stenosis on the right and 50% on the left. 4. Moderate stenosis of both vertebral artery origins. Aortic Atherosclerosis (ICD10-I70.0). Electronically Signed   By: Deatra Robinson M.D.   On: 10/22/2022 21:03    Procedures Procedures    Medications Ordered in ED Medications   enoxaparin (LOVENOX) injection 40 mg (40 mg Subcutaneous Given 10/23/22 1121)  amiodarone (PACERONE) tablet 200 mg (200 mg Oral Given 10/23/22 1121)  rosuvastatin (CRESTOR) tablet 20 mg (has no administration in time range)  donepezil (ARICEPT) tablet 10 mg (10 mg Oral Given 10/23/22 1121)  oxymetazoline (AFRIN) 0.05 % nasal spray 1 spray (1 spray Each Nare Given 10/23/22 0410)  sodium chloride (OCEAN) 0.65 % nasal spray 1 spray (has no administration in time range)  mirtazapine (REMERON) tablet 7.5 mg (has no administration in time range)  aspirin EC tablet 81 mg (has no administration in time range)  Oral care mouth rinse (has no administration in  time range)  iohexol (OMNIPAQUE) 350 MG/ML injection 75 mL (75 mLs Intravenous Contrast Given 10/22/22 1925)  aspirin chewable tablet 81 mg (81 mg Oral Given 10/23/22 0347)    ED Course/ Medical Decision Making/ A&P                                 Medical Decision Making Amount and/or Complexity of Data Reviewed Labs: ordered. Radiology: ordered.  Risk Prescription drug management. Decision regarding hospitalization.   Patient is an 89 year old male with a history of hypertension, hyperlipidemia, OSA, Alzheimer's disease, prostate cancer, T2DM presenting for vision change.  On my initial evaluation, he is afebrile, hemodynamically stable, in no acute distress.  Per wife, he did have a ground-level fall this morning and later this evening developed difficulty with his vision as well as difficulty walking.  On my exam, he has possible limitation in his peripheral vision but otherwise symptoms seem to have resolved.  Presentation is concerning for potential ischemic versus hemorrhagic stroke given his history of fall this morning.  As his symptoms are mostly posterior, will obtain CTA for vessel imaging.  Additionally considered hypoglycemia however on arrival here blood sugar was normal.  Will obtain labs to evaluate for potential electrolyte or  metabolic abnormalities or infection.  Results reviewed.  CBC without leukocytosis, mild anemia present with hemoglobin 12.0.  CMP with overall normal electrolytes, no AKI or anion gap.  CTA of the head and neck demonstrates short segment occlusion present of a segment of the right PCA.  Given CT findings which may coordinate with patient's earlier symptoms, neurology consulted.  Recommending obtaining MRI to further evaluate for stroke versus TIA.  As patient has a pacemaker present, this process will be prolonged therefore patient was admitted for further stroke workup.  All results were discussed with patient and wife at bedside.        Final Clinical Impression(s) / ED Diagnoses Final diagnoses:  Visual disturbance    Rx / DC Orders ED Discharge Orders     None         Claretha Cooper, DO 10/23/22 1351    Lorre Nick, MD 10/23/22 [phone]

## 2022-10-22 NOTE — ED Provider Notes (Signed)
I saw and evaluated the patient, reviewed the resident's note and I agree with the findings and plan.    ED ECG REPORT   Date: 10/22/2022  Rate: 81  Rhythm: indeterminate  QRS Axis: normal  Intervals: normal  ST/T Wave abnormalities: nonspecific ST changes  Conduction Disutrbances:nonspecific intraventricular conduction delay  Narrative Interpretation:   Old EKG Reviewed: none available  I have personally reviewed the EKG tracing and agree with the computerized printout as noted.   89 year old male presents after having visual disturbance since morning.  Patient states that he had double vision.  States he felt shaky.  No associated cardiac symptoms.  Everything has since resolved.  On exam here, he is without focal findings.  Will check labs, head CT, and reassess          Lorre Nick, MD 10/22/22 [phone]

## 2022-10-22 NOTE — ED Triage Notes (Signed)
PT BIB EMS from home, fell this morning hit his head, 3 stitches, went to [place] at 0500, at 1500 today a new onset of blurred vision with lines with a new onset of tremors and gait disturbance on the way to a routine cardiologist appointment.  No complaint of headache, nausea, or vomiting.  178/110 HR 60 99% RA  CBG 124 18L AC

## 2022-10-22 NOTE — Progress Notes (Signed)
Patient arrived in follow-up for recently implanted generator replacement.  He fell this morning and had been seen at the emergency room at [facility] with suturing of his ear and scanning of his head.  On his way to the clinic this afternoon he began to have difficulty with vision and unable to read the street signs.  Worsening headache.  While we were deciding what to do, he began to develop chest pain and shakes.  ECG was unremarkable.  Blood pressure was in the 170+ range oxygen saturations were 97 Gross neurological examination was normal.  Notably has significant dementia

## 2022-10-22 NOTE — Telephone Encounter (Signed)
Patient presented for appointment. As he was being walked back he started complaining of feeling shaky and chest pain. He was seated and code blue was called at 1632. Dr. Graciela Husbands and Dr. Anne Fu (DOD) responded. BP was 180/97 O2 was 96. EMS was called. EKG showed flat ST segments. Neuro exam was negative- preformed by Dr. Anne Fu. Patient had fallen earlier that day around 0530- hit his head. He had CT done at [facility] that was negative. He started complaining of blurred vision around 1500. EMS arrived at 1639 to transport to [facility].

## 2022-10-23 ENCOUNTER — Inpatient Hospital Stay (HOSPITAL_COMMUNITY): Payer: Medicare Other

## 2022-10-23 ENCOUNTER — Other Ambulatory Visit (HOSPITAL_COMMUNITY): Payer: Self-pay | Admitting: *Deleted

## 2022-10-23 DIAGNOSIS — R2990 Unspecified symptoms and signs involving the nervous system: Secondary | ICD-10-CM | POA: Diagnosis not present

## 2022-10-23 LAB — LIPID PANEL
Cholesterol: 239 mg/dL — ABNORMAL HIGH (ref 0–200)
HDL: 61 mg/dL (ref >40)
LDL Cholesterol: 160 mg/dL — ABNORMAL HIGH (ref 0–99)
Total CHOL/HDL Ratio: 3.9 ratio
Triglycerides: 89 mg/dL (ref <150)
VLDL: 18 mg/dL (ref 0–40)

## 2022-10-23 LAB — CBC
HCT: 38.6 % — ABNORMAL LOW (ref 39.0–52.0)
Hemoglobin: 12.7 g/dL — ABNORMAL LOW (ref 13.0–17.0)
MCH: 31.3 pg (ref 26.0–34.0)
MCHC: 32.9 g/dL (ref 30.0–36.0)
MCV: 95.1 fL (ref 80.0–100.0)
Platelets: 186 10*3/uL (ref 150–400)
RBC: 4.06 MIL/uL — ABNORMAL LOW (ref 4.22–5.81)
RDW: 14 % (ref 11.5–15.5)
WBC: 6.1 10*3/uL (ref 4.0–10.5)
nRBC: 0 % (ref 0.0–0.2)

## 2022-10-23 LAB — BASIC METABOLIC PANEL
Anion gap: 13 (ref 5–15)
BUN: 12 mg/dL (ref 8–23)
CO2: 22 mmol/L (ref 22–32)
Calcium: 8.7 mg/dL — ABNORMAL LOW (ref 8.9–10.3)
Chloride: 101 mmol/L (ref 98–111)
Creatinine, Ser: 0.96 mg/dL (ref 0.61–1.24)
GFR, Estimated: >60 mL/min (ref >60)
Glucose, Bld: 96 mg/dL (ref 70–99)
Potassium: 3.7 mmol/L (ref 3.5–5.1)
Sodium: 136 mmol/L (ref 135–145)

## 2022-10-23 LAB — HEMOGLOBIN A1C
Hgb A1c MFr Bld: 6 % — ABNORMAL HIGH (ref 4.8–5.6)
Mean Plasma Glucose: 126 mg/dL

## 2022-10-23 MED ORDER — OXYMETAZOLINE HCL 0.05 % NA SOLN
1.0000 | Freq: Two times a day (BID) | NASAL | Status: DC | PRN
  Administered 2022-10-23: 1 via NASAL
  Filled 2022-10-23: qty 30

## 2022-10-23 MED ORDER — SALINE SPRAY 0.65 % NA SOLN: 1.0000 | NASAL | Status: DC | PRN

## 2022-10-23 MED ORDER — ASPIRIN 81 MG PO CHEW
81.0000 mg | CHEWABLE_TABLET | Freq: Once | ORAL | Status: AC
  Administered 2022-10-23: 81 mg via ORAL
  Filled 2022-10-23: qty 1

## 2022-10-23 MED ORDER — ASPIRIN 81 MG PO TBEC
81.0000 mg | DELAYED_RELEASE_TABLET | Freq: Every day | ORAL | Status: DC
  Administered 2022-10-23 – 2022-10-24 (×2): 81 mg via ORAL
  Filled 2022-10-23 (×2): qty 1

## 2022-10-23 MED ORDER — ORAL CARE MOUTH RINSE: 15.0000 mL | OROMUCOSAL | Status: DC | PRN

## 2022-10-23 MED ORDER — MIRTAZAPINE 15 MG PO TABS
7.5000 mg | ORAL_TABLET | Freq: Every day | ORAL | Status: DC
  Administered 2022-10-23: 7.5 mg via ORAL
  Filled 2022-10-23: qty 1

## 2022-10-23 NOTE — ED Notes (Signed)
Pacemaker interrogation done at bedside with [employer].

## 2022-10-23 NOTE — ED Notes (Signed)
ED TO INPATIENT HANDOFF REPORT  ED Nurse Name and Phone #:  Latif Nazareno 75  S Name/Age/Gender Alan [name] 89 y.o. male Room/Bed: 044C/044C  Code Status   Code Status: Full Code  Home/SNF/Other Home Patient oriented to: situation Is this baseline? Yes   Triage Complete: Triage complete  Chief Complaint Stroke-like episode [R29.90]  Triage Note PT BIB EMS from home, fell this morning hit his head, 3 stitches, went to [facility] at 0500, at 1500 today a new onset of blurred vision with lines with a new onset of tremors and gait disturbance on the way to a routine cardiologist appointment.  No complaint of headache, nausea, or vomiting.  178/110 HR 60 99% RA  CBG 124 18L AC   Allergies Allergies  Allergen Reactions   Cefdinir Other (See Comments)    confusion   Effexor [Venlafaxine] Other (See Comments)    "extremely dizzy"   Sulfa Antibiotics Other (See Comments) and Swelling   Amlodipine Besylate Swelling    Swelling of scrotum    Aliskiren Other (See Comments)    Lower back pain   Sulfamethoxazole     throat swelled   Tekturna Hct [Aliskiren-Hydrochlorothiazide]     Lower back pain    Level of Care/Admitting Diagnosis ED Disposition     ED Disposition  Admit   Condition  --   Comment  Hospital Area: [facility] [[id number]]  Level of Care: Telemetry Medical [104]  May admit patient to [facility] or [facility] if equivalent level of care is available:: Yes  Covid Evaluation: Asymptomatic - no recent exposure (last 10 days) testing not required  Diagnosis: Stroke-like episode [740820]  Admitting Physician: Dolly Rias [[id number]]  Attending Physician: Dolly Rias [[id number]]  Certification:: I certify this patient will need inpatient services for at least 2 midnights  Expected Medical Readiness: 10/27/2022          B Medical/Surgery History Past Medical History:  Diagnosis Date   Anemia    Anxiety 07/19/2008   Qualifier:  Diagnosis of  By: Wyn Quaker CMA, Amanda     Arthritis    Bradycardia    chronotropic incompetence   Carotid stenosis    <60% 2012   Diabetes mellitus    Borderline   Diverticulosis    Esophageal stricture    Exercise intolerance    Hemorrhoids    Hernia    Hypertension    Lightheadedness 10/14/2010   Peptic ulcer disease    Prostate cancer (HCC)    Vitamin B 12 deficiency    Past Surgical History:  Procedure Laterality Date   PPM GENERATOR CHANGEOUT N/A 06/17/2022   Procedure: PPM GENERATOR CHANGEOUT;  Surgeon: Duke Salvia, MD;  Location: [facility] INVASIVE CV LAB;  Service: Cardiovascular;  Laterality: N/A;   RETROPUBIC PROSTATECTOMY  2005   Radical   SKIN SURGERY     basal cell carcinoma     A IV Location/Drains/Wounds Patient Lines/Drains/Airways Status     Active Line/Drains/Airways     Name Placement date Placement time Site Days   Peripheral IV 10/22/22 18 G Left Antecubital 10/22/22  --  Antecubital  1            Intake/Output Last 24 hours No intake or output data in the 24 hours ending 10/23/22 0858  Labs/Imaging Results for orders placed or performed during the hospital encounter of 10/22/22 (from the past 48 hour(s))  CBC with Differential     Status: Abnormal   Collection Time: 10/22/22  6:19 PM  Result Value Ref Range   WBC 6.9 4.0 - 10.5 K/uL   RBC 3.83 (L) 4.22 - 5.81 MIL/uL   Hemoglobin 12.0 (L) 13.0 - 17.0 g/dL   HCT 86.5 (L) 78.4 - 69.6 %   MCV 97.1 80.0 - 100.0 fL   MCH 31.3 26.0 - 34.0 pg   MCHC 32.3 30.0 - 36.0 g/dL   RDW 29.5 28.4 - 13.2 %   Platelets 199 150 - 400 K/uL   nRBC 0.0 0.0 - 0.2 %   Neutrophils Relative % 78 %   Neutro Abs 5.4 1.7 - 7.7 K/uL   Lymphocytes Relative 10 %   Lymphs Abs 0.7 0.7 - 4.0 K/uL   Monocytes Relative 9 %   Monocytes Absolute 0.6 0.1 - 1.0 K/uL   Eosinophils Relative 2 %   Eosinophils Absolute 0.1 0.0 - 0.5 K/uL   Basophils Relative 1 %   Basophils Absolute 0.1 0.0 - 0.1 K/uL   Immature Granulocytes 0  %   Abs Immature Granulocytes 0.02 0.00 - 0.07 K/uL    Comment: Performed at [facility], [address] [place], [place] [address]  Comprehensive metabolic panel     Status: Abnormal   Collection Time: 10/22/22  6:19 PM  Result Value Ref Range   Sodium 136 135 - 145 mmol/L   Potassium 4.0 3.5 - 5.1 mmol/L   Chloride 100 98 - 111 mmol/L   CO2 25 22 - 32 mmol/L   Glucose, Bld 105 (H) 70 - 99 mg/dL    Comment: Glucose reference range applies only to samples taken after fasting for at least 8 hours.   BUN 15 8 - 23 mg/dL   Creatinine, Ser 2.72 0.61 - 1.24 mg/dL   Calcium 8.7 (L) 8.9 - 10.3 mg/dL   Total Protein 5.9 (L) 6.5 - 8.1 g/dL   Albumin 3.4 (L) 3.5 - 5.0 g/dL   AST 30 15 - 41 U/L   ALT 19 0 - 44 U/L   Alkaline Phosphatase 73 38 - 126 U/L   Total Bilirubin 0.7 0.3 - 1.2 mg/dL   GFR, Estimated >53 >66 mL/min    Comment: (NOTE) Calculated using the CKD-EPI Creatinine Equation (2021)    Anion gap 11 5 - 15    Comment: Performed at [facility], [address], [place], [place] [address]  Basic metabolic panel     Status: Abnormal   Collection Time: 10/23/22  1:27 AM  Result Value Ref Range   Sodium 136 135 - 145 mmol/L   Potassium 3.7 3.5 - 5.1 mmol/L   Chloride 101 98 - 111 mmol/L   CO2 22 22 - 32 mmol/L   Glucose, Bld 96 70 - 99 mg/dL    Comment: Glucose reference range applies only to samples taken after fasting for at least 8 hours.   BUN 12 8 - 23 mg/dL   Creatinine, Ser 7.42 0.61 - 1.24 mg/dL   Calcium 8.7 (L) 8.9 - 10.3 mg/dL   GFR, Estimated >59 >56 mL/min    Comment: (NOTE) Calculated using the CKD-EPI Creatinine Equation (2021)    Anion gap 13 5 - 15    Comment: Performed at [facility], [address], [place], [place] [address]  CBC     Status: Abnormal   Collection Time: 10/23/22  1:27 AM  Result Value Ref Range   WBC 6.1 4.0 - 10.5 K/uL   RBC 4.06 (L) 4.22 - 5.81 MIL/uL  Hemoglobin 12.7 (L) 13.0 - 17.0 g/dL   HCT 16.1 (L) 09.6 -  52.0 %   MCV 95.1 80.0 - 100.0 fL   MCH 31.3 26.0 - 34.0 pg   MCHC 32.9 30.0 - 36.0 g/dL   RDW 04.5 40.9 - 81.1 %   Platelets 186 150 - 400 K/uL   nRBC 0.0 0.0 - 0.2 %    Comment: Performed at [facility], [address] [place], [place] [address]  Lipid panel     Status: Abnormal   Collection Time: 10/23/22  1:27 AM  Result Value Ref Range   Cholesterol 239 (H) 0 - 200 mg/dL   Triglycerides 89 <295 mg/dL   HDL 61 >62 mg/dL   Total CHOL/HDL Ratio 3.9 RATIO   VLDL 18 0 - 40 mg/dL   LDL Cholesterol 130 (H) 0 - 99 mg/dL    Comment:        Total Cholesterol/HDL:CHD Risk Coronary Heart Disease Risk Table                     Men   Women  1/2 Average Risk   3.4   3.3  Average Risk       5.0   4.4  2 X Average Risk   9.6   7.1  3 X Average Risk  23.4   11.0        Use the calculated Patient Ratio above and the CHD Risk Table to determine the patient's CHD Risk.        ATP III CLASSIFICATION (LDL):  <100     mg/dL   Optimal  865-784  mg/dL   Near or Above                    Optimal  130-159  mg/dL   Borderline  696-295  mg/dL   High  >284     mg/dL   Very High Performed at [facility], [address], [place], [place] [address]    CT ANGIO HEAD NECK W WO CM  Result Date: 10/22/2022 CLINICAL DATA:  Headache and visual field changes. EXAM: CT ANGIOGRAPHY HEAD AND NECK WITH AND WITHOUT CONTRAST TECHNIQUE: Multidetector CT imaging of the head and neck was performed using the standard protocol during bolus administration of intravenous contrast. Multiplanar CT image reconstructions and MIPs were obtained to evaluate the vascular anatomy. Carotid stenosis measurements (when applicable) are obtained utilizing NASCET criteria, using the distal internal carotid diameter as the denominator. RADIATION DOSE REDUCTION: This exam was performed according to the departmental dose-optimization program which includes automated exposure control, adjustment of the mA and/or kV according  to patient size and/or use of iterative reconstruction technique. CONTRAST:  75mL OMNIPAQUE IOHEXOL 350 MG/ML SOLN COMPARISON:  Head CT 10/22/2022 FINDINGS: CT HEAD FINDINGS Brain: Mild volume loss. No mass, hemorrhage or extra-axial collection. Vascular: Carotid and vertebral artery calcifications at the skull base. Skull: No acute finding. Sinuses/Orbits: Paranasal sinuses are clear. No mastoid effusion. Normal orbits. Other: None Review of the MIP images confirms the above findings CTA NECK FINDINGS Aortic arch: Calcific aortic atherosclerosis. Normal 3 vessel branching pattern. Subclavian arteries are widely patent. Right carotid system: Moderate atherosclerotic calcification of the distal common carotid artery and carotid bifurcation resulting in approximately 40% stenosis at the ICA origin. Mild atherosclerotic calcification of the distal ICA. Left carotid system: Moderate atherosclerosis at the carotid bifurcation causing approximately 50% stenosis at the ICA origin. Mild calcification in  the distal ICA. Vertebral arteries: Left dominant system. Mild multifocal calcification. There is moderate stenosis of both vertebral artery origins. Skeleton: Negative. Other neck: Negative. Upper chest: Negative Review of the MIP images confirms the above findings CTA HEAD FINDINGS Anterior circulation: --Intracranial internal carotid arteries: Atherosclerotic calcification of both internal carotid arteries at the skull base with mild stenosis of the clinoid segments. --Anterior cerebral arteries (ACA): Normal. --Middle cerebral arteries (MCA): Normal. Posterior circulation: --Vertebral arteries: Bilateral atherosclerotic calcification without significant stenosis. --Inferior cerebellar arteries: Normal. --Basilar artery: Mild narrowing of the distal basilar artery --Superior cerebellar arteries: Normal. --Posterior cerebral arteries: Multifocal atherosclerotic irregularity of the right PCA with a short segment occlusion  of the proximal P5 segment (series 7, image 96). There is also multifocal irregularity of the left PCA with multiple mild stenoses but no occlusion. Venous sinuses: As permitted by contrast timing, patent. Anatomic variants: None Review of the MIP images confirms the above findings IMPRESSION: 1. Short segment occlusion of the proximal P 5 segment of the right posterior cerebral artery. 2. Multifocal mild stenosis of the left PCA. 3. Bilateral carotid bifurcation atherosclerosis with approximately 40% stenosis on the right and 50% on the left. 4. Moderate stenosis of both vertebral artery origins. Aortic Atherosclerosis (ICD10-I70.0). Electronically Signed   By: Deatra Robinson M.D.   On: 10/22/2022 21:03    Pending Labs Unresulted Labs (From admission, onward)     Start     Ordered   10/23/22 0500  Hemoglobin A1c  Tomorrow morning,   R        10/22/22 2329            Vitals/Pain Today's Vitals   10/23/22 0347 10/23/22 0822 10/23/22 0829 10/23/22 0836  BP:  (!) 165/95  (!) 152/66  Pulse:  73  70  Resp:  16  15  Temp: 98 F (36.7 C) 97.9 F (36.6 C)  98 F (36.7 C)  TempSrc: Oral Oral  Oral  SpO2:  98%  98%  Weight:   150 lb (68 kg)   Height:   5\' 7"  (1.702 m)     Isolation Precautions No active isolations  Medications Medications  enoxaparin (LOVENOX) injection 40 mg (has no administration in time range)  amiodarone (PACERONE) tablet 200 mg (has no administration in time range)  rosuvastatin (CRESTOR) tablet 20 mg (has no administration in time range)  donepezil (ARICEPT) tablet 10 mg (has no administration in time range)  oxymetazoline (AFRIN) 0.05 % nasal spray 1 spray (1 spray Each Nare Given 10/23/22 0410)  sodium chloride (OCEAN) 0.65 % nasal spray 1 spray (has no administration in time range)  mirtazapine (REMERON) tablet 7.5 mg (has no administration in time range)  iohexol (OMNIPAQUE) 350 MG/ML injection 75 mL (75 mLs Intravenous Contrast Given 10/22/22 1925)  aspirin  chewable tablet 81 mg (81 mg Oral Given 10/23/22 0347)    Mobility walks     Focused Assessments Neuro Assessment Handoff:  Swallow screen pass? Yes          Neuro Assessment: Within Defined Limits Neuro Checks:      Has TPA been given? No If patient is a Neuro Trauma and patient is going to OR before floor call report to 4N Charge nurse: [phone] or [phone]   R Recommendations: See Admitting Provider Note  Report given to:   Additional Notes:  A&O x 4, no neuro deficits, no complaints

## 2022-10-23 NOTE — H&P (Signed)
History and Physical    Patient: Alan [name]:[id number] DOB: [birth year] DOA: 10/22/2022 DOS: the patient was seen and examined on 10/23/2022 PCP: Paulina Fusi, MD  Patient coming from:  Cardiology office  Chief Complaint:  Chief Complaint  Patient presents with   Visual Field Change   HPI: Alan [name] is a 89 y.o. male with medical history significant for Alzheimers dementia, carotid artery disease, SVT, bradycardia, PPM, prostate CA, DM, HLD, who was brought in from cardiologists office due to episode of visual change followed by gait instability and tremors during office appt. He had a ground level fall this AM and suffered a posterior scalp hematoma and a laceration of the R ear was seen at [facility] ED this AM, CT head negative for acute abnormality. Then later while in car noted to wife that he "could not read the street signs" and continued to note trouble with vision. Brought to cardiology appt and had hand tremors, then more instability when standing for weight and thus advised to go to ED. At present unable to provide any history due to his dementia, history provided by wife. Confirms story above. No recent illness, decreased PO intake, fevers, chills, chest pain, sob, abd pain, n/v/d, dysuria, other complaints. Did have another recent ground level fall. Note recent falls unwitnessed, patient unable to comment re: syncope. He has chronic incontinence with hx prostate CA. No post ictal periods, seizure activity.    Review of Systems: As mentioned in the history of present illness. All other systems reviewed and are negative. Past Medical History:  Diagnosis Date   Anemia    Anxiety 07/19/2008   Qualifier: Diagnosis of  By: Wyn Quaker CMA, Marchelle Folks     Arthritis    Bradycardia    chronotropic incompetence   Carotid stenosis    <60% 2012   Diabetes mellitus    Borderline   Diverticulosis    Esophageal stricture    Exercise intolerance    Hemorrhoids    Hernia     Hypertension    Lightheadedness 10/14/2010   Peptic ulcer disease    Prostate cancer (HCC)    Vitamin B 12 deficiency    Past Surgical History:  Procedure Laterality Date   PPM GENERATOR CHANGEOUT N/A 06/17/2022   Procedure: PPM GENERATOR CHANGEOUT;  Surgeon: Duke Salvia, MD;  Location: [facility] INVASIVE CV LAB;  Service: Cardiovascular;  Laterality: N/A;   RETROPUBIC PROSTATECTOMY  2005   Radical   SKIN SURGERY     basal cell carcinoma   Social History:  reports that he has never smoked. He has never used smokeless tobacco. He reports that he does not drink alcohol and does not use drugs.  Allergies  Allergen Reactions   Cefdinir Other (See Comments)    confusion   Effexor [Venlafaxine] Other (See Comments)    "extremely dizzy"   Sulfa Antibiotics Other (See Comments) and Swelling   Amlodipine Besylate Swelling    Swelling of scrotum    Aliskiren Other (See Comments)    Lower back pain   Sulfamethoxazole     throat swelled   Tekturna Hct [Aliskiren-Hydrochlorothiazide]     Lower back pain    Family History  Problem Relation Age of Onset   Heart disease Mother    Stroke Mother    Cancer Father        prostate   Stroke Father    Cancer Paternal Grandfather        prostate    Prior to  Admission medications   Medication Sig Start Date End Date Taking? Authorizing Provider  alclomethasone (ACLOVATE) 0.05 % ointment Apply 1 application  topically daily. 10/05/22  Yes [provider]  ALPRAZolam Prudy Feeler) 0.25 MG tablet Take 0.25 mg by mouth at bedtime. May take another tablet by mouth if awakens during the night 10/24/14  Yes [provider]  amiodarone (PACERONE) 200 MG tablet Take one tablet by mouth twice daily for 1 weeks, then take one tablet by mouth once daily thereafter 09/29/22  Yes Graciella Freer, PA-C  b complex vitamins tablet Take 1 tablet by mouth daily with lunch.   Yes [provider]  Cholecalciferol (VITAMIN D) 50 MCG (2000 UT)  tablet Take 2,000 Units by mouth daily in the afternoon.   Yes [provider]  CINNAMON PO Take 1 tablet by mouth 2 (two) times daily.   Yes [provider]  Coenzyme Q10 (COQ-10 PO) Take 1 tablet by mouth daily with supper.   Yes [provider]  CRESTOR 20 MG tablet Take 20 mg by mouth daily with supper. 09/20/14  Yes [provider]  Cyanocobalamin (B-12 PO) Take 1 capsule by mouth daily in the afternoon.   Yes [provider]  donepezil (ARICEPT) 10 MG tablet Take 10 mg by mouth daily.   Yes [provider]  furosemide (LASIX) 20 MG tablet Take 20 mg by mouth daily. 11/20/21  Yes [provider]  GARLIC PO Take 1 tablet by mouth daily.   Yes [provider]  loperamide (IMODIUM A-D) 2 MG tablet Take 2-4 mg by mouth 4 (four) times daily as needed for diarrhea or loose stools.   Yes [provider]  mirtazapine (REMERON) 30 MG tablet Take 30 mg by mouth at bedtime. 03/29/21  Yes [provider]  Omega-3 Fatty Acids (OMEGA 3 PO) Take 1 capsule by mouth daily.   Yes [provider]  vitamin C (ASCORBIC ACID) 500 MG tablet Take 500 mg by mouth 2 (two) times daily.   Yes [provider]  Xylometazoline HCl (OTRIVIN NA) Place 2 sprays into both nostrils daily as needed (nasal congestion). Nasal spray as needed twice daily   Yes [provider]  Zinc 25 MG TABS Take 25 mg by mouth daily in the afternoon.   Yes [provider]  Zinc Oxide (TRIPLE PASTE) 12.8 % ointment Apply 1 Application topically at bedtime as needed (when using mupirocin).   Yes [provider]  Famotidine (PEPCID PO) Take 1 tablet by mouth daily as needed (pain). Patient not taking: Reported on 09/29/2022    [provider]    Physical Exam: Vitals:   10/22/22 2045 10/22/22 2100 10/22/22 2115 10/22/22 2145  BP: (!) 142/77 (!) 183/90 138/86 136/76  Pulse: 70 72 71 71  Resp: 14 16 16 18    Temp:      TempSrc:      SpO2: 97% 99% 94% 94%    Gen: Awake, alert, NAD, chronically ill appearing.  HEENT: L posterior scalp hematoma, no active bleeding. There is repaired lac of the R earlobe  Neck: C spine nontender  CV: Regular, normal S1, S2, no murmurs  Resp: Normal WOB, CTAB  Abd: Flat, normoactive, nontender MSK: Symmetric, no edema  Skin: See HEENT for more findings.  Neuro: Alert and interactive. Oriented to self only. Memory intact to remote life events. Speech is clear. CN II with no homonymous deficit to CF. Other CN intact. Motor is 5/5 and  symmetric, and sensation is intact and equal to fine touch. Psych: euthymic, appropriate  Data Reviewed:   Reveiwed, notable for: - + carotid stenosis bilateral, vertebral artery stenosis, R short segment P5 occlusion,  Imaging:  CT ANGIO HEAD NECK W WO CM  Result Date: 10/22/2022 CLINICAL DATA:  Headache and visual field changes. EXAM: CT ANGIOGRAPHY HEAD AND NECK WITH AND WITHOUT CONTRAST TECHNIQUE: Multidetector CT imaging of the head and neck was performed using the standard protocol during bolus administration of intravenous contrast. Multiplanar CT image reconstructions and MIPs were obtained to evaluate the vascular anatomy. Carotid stenosis measurements (when applicable) are obtained utilizing NASCET criteria, using the distal internal carotid diameter as the denominator. RADIATION DOSE REDUCTION: This exam was performed according to the departmental dose-optimization program which includes automated exposure control, adjustment of the mA and/or kV according to patient size and/or use of iterative reconstruction technique. CONTRAST:  75mL OMNIPAQUE IOHEXOL 350 MG/ML SOLN COMPARISON:  Head CT 10/22/2022 FINDINGS: CT HEAD FINDINGS Brain: Mild volume loss. No mass, hemorrhage or extra-axial collection. Vascular: Carotid and vertebral artery calcifications at the skull base. Skull: No acute finding. Sinuses/Orbits: Paranasal sinuses  are clear. No mastoid effusion. Normal orbits. Other: None Review of the MIP images confirms the above findings CTA NECK FINDINGS Aortic arch: Calcific aortic atherosclerosis. Normal 3 vessel branching pattern. Subclavian arteries are widely patent. Right carotid system: Moderate atherosclerotic calcification of the distal common carotid artery and carotid bifurcation resulting in approximately 40% stenosis at the ICA origin. Mild atherosclerotic calcification of the distal ICA. Left carotid system: Moderate atherosclerosis at the carotid bifurcation causing approximately 50% stenosis at the ICA origin. Mild calcification in the distal ICA. Vertebral arteries: Left dominant system. Mild multifocal calcification. There is moderate stenosis of both vertebral artery origins. Skeleton: Negative. Other neck: Negative. Upper chest: Negative Review of the MIP images confirms the above findings CTA HEAD FINDINGS Anterior circulation: --Intracranial internal carotid arteries: Atherosclerotic calcification of both internal carotid arteries at the skull base with mild stenosis of the clinoid segments. --Anterior cerebral arteries (ACA): Normal. --Middle cerebral arteries (MCA): Normal. Posterior circulation: --Vertebral arteries: Bilateral atherosclerotic calcification without significant stenosis. --Inferior cerebellar arteries: Normal. --Basilar artery: Mild narrowing of the distal basilar artery --Superior cerebellar arteries: Normal. --Posterior cerebral arteries: Multifocal atherosclerotic irregularity of the right PCA with a short segment occlusion of the proximal P5 segment (series 7, image 96). There is also multifocal irregularity of the left PCA with multiple mild stenoses but no occlusion. Venous sinuses: As permitted by contrast timing, patent. Anatomic variants: None Review of the MIP images confirms the above findings IMPRESSION: 1. Short segment occlusion of the proximal P 5 segment of the right posterior  cerebral artery. 2. Multifocal mild stenosis of the left PCA. 3. Bilateral carotid bifurcation atherosclerosis with approximately 40% stenosis on the right and 50% on the left. 4. Moderate stenosis of both vertebral artery origins. Aortic Atherosclerosis (ICD10-I70.0). Electronically Signed   By: Deatra Robinson M.D.   On: 10/22/2022 21:03       Assessment and Plan:  89 y/o M with hx significant for Alzheimers dementia, carotid artery disease, SVT, bradycardia, PPM, prostate CA, DM, HLD, who was brought in from cardiologists office due to episode of visual change followed by gait instability and tremors during office appt.   Visual change, without homonymous deficit  Gait instability and temors  Suspect symptoms of vision change moreso related to concussion / mild TBI from ground level fall. Less likely stroke, but found with  short segment R P5 occlusion on CTA.  - Neurology consulted by ED  - MRI brain without contrast pending, will need to be arranged with PPM  - Start on aspirin 81 mg daily for secondary prevention, if there is stroke may need limited course of DAPT with hx intracranial stenosis; however at higher risk of bleeding due to recent falls.  - Tele monitoring  - PT evaluation  - swallow screen, then [facility] diet  - check lipids, A1c  - permissive BP x 24 hr   Ground level fall  Unclear if syncope, unwitnessed and poor historian  Scalp hematoma, present on admission  R earlobe laceration, repaired at OSH ED  - Tele monitoring,  - PT evaluation   Chronic medical problems:   Prostate CA  Hx bradycardia with PPM - Need to Interrogate pacer HTN - Not currently on antihypertensives. Hold home lasix  HLD - continue home rosuvastatin  SVT - Continue home amiodarone  Alzheimers dementia, mood d/o - hold PM Xanax. Continue donepezil, Mirtazapine  Hx DM, without elevated glucose - can add SSI if running high    Advance Care Planning:   Code Status: Full Code ; Of note both patient  and wife unsure during discussions of code status. Initially wife reports thinks he would want DNR/DNI, but chose to leave as full code for now while they discuss more   Consults: Neurology   Family Communication: Yes, wife at bedside   Severity of Illness: The appropriate patient status for this patient is INPATIENT. Inpatient status is judged to be reasonable and necessary in order to provide the required intensity of service to ensure the patient's safety. The patient's presenting symptoms, physical exam findings, and initial radiographic and laboratory data in the context of their chronic comorbidities is felt to place them at high risk for further clinical deterioration. Furthermore, it is not anticipated that the patient will be medically stable for discharge from the hospital within 2 midnights of admission.   * I certify that at the point of admission it is my clinical judgment that the patient will require inpatient hospital care spanning beyond 2 midnights from the point of admission due to high intensity of service, high risk for further deterioration and high frequency of surveillance required.*  Author: Dolly Rias, MD 10/23/2022 2:59 AM  For on call review [email or web address].

## 2022-10-23 NOTE — Hospital Course (Signed)
89 year old white male radical prostatectomy for adenocarcinoma 2005 Dr. Earlene Plater SVT bradycardia followed by Dr. Graciela Husbands with a [employer] Accolade PPM replaced 06/17/2022 Alzheimer's which is moderate to severe Borderline diabetes mellitus Peptic ulcer disease with dysphagia stricture  Larey Seat hit his head 9/12 at around 0 500 AM with new onset blurry vision tremors and gait disturbance while on the way to routine cardiology appointment-Dr. Graciela Husbands and Dr. Anne Fu saw the patient they did an EKG neuroexam was negative he was transported to [facility] and then subsequently at around 1440 brought to [facility] EMS was called [facility] CTA no hemorrhage no large stroke however R PCA P3-P4 short segment occlusion Neurology saw the patient in consult recommended MRI brain without contrast need to be arranged with pacemaker Was started on aspirin 81 For right ear lobe laceration he had sutures placed at the outside hospital

## 2022-10-23 NOTE — ED Notes (Signed)
Sitting at bedside in chair to eat breakfast. Wife at bedside to have breakfast with patient.

## 2022-10-23 NOTE — Progress Notes (Addendum)
STROKE TEAM PROGRESS NOTE   BRIEF HPI Mr. Alan [name] is a 89 y.o. male with history of  PMH significant for alzheimers type dementia, DM2, HTN, PUD, SVT and bradycardia with pacemaker, who presents with fall this AM, episode of tremulousness, blurred vision in the afternoon.   EMS called and he was brought in to the ED where [facility] and CTA with no hemorrhage, no large stroke. CTA notable for R PCA P3-4 short segment occlusion.    SIGNIFICANT HOSPITAL EVENTS 9/12: CT H/CTA negative.  Short segment occlusion of the right distal posterior cerebral artery pending MRI. 9/13: Transferred to floor.  INTERIM HISTORY/SUBJECTIVE  On interview with wife on room, patient is sitting in a wheelchair with no acute distress.  Per wife, fell yesterday morning and hit the back of his head, with visible hematoma.  Presented to [facility] to [facility] ED: CT head showed no acute abnormality, was discharged.  Later that day en route to his outpatient cardiology appointment, patient complained of blurry vision.  Denies vision loss.  Wife noted that he was very slightly slower than he typically is, and shakier.  Had an episode of jerking, but did not lose consciousness or appear to seize.  Wife notes that he has fallen 4 times over the last week, including last Friday and last Wednesday.  He himself does not remember falling this much.  Adherent to medications.  Patient ambulates without assistance of a walker or cane.  As of now, patient is back to his baseline.  Patient says that he does not feel meaningfully different than 24 hours ago.  No headache.  No vision changes at this time.  Nonfocal neurological exam.   OBJECTIVE  CBC    Component Value Date/Time   WBC 6.1 10/23/2022 0127   RBC 4.06 (L) 10/23/2022 0127   HGB 12.7 (L) 10/23/2022 0127   HGB 14.2 06/03/2022 1425   HCT 38.6 (L) 10/23/2022 0127   HCT 42.1 06/03/2022 1425   PLT 186 10/23/2022 0127   PLT 209 06/03/2022 1425   MCV 95.1 10/23/2022  0127   MCV 95 06/03/2022 1425   MCH 31.3 10/23/2022 0127   MCHC 32.9 10/23/2022 0127   RDW 14.0 10/23/2022 0127   RDW 12.6 06/03/2022 1425   LYMPHSABS 0.7 10/22/2022 1819   MONOABS 0.6 10/22/2022 1819   EOSABS 0.1 10/22/2022 1819   BASOSABS 0.1 10/22/2022 1819    BMET    Component Value Date/Time   NA 136 10/23/2022 0127   NA 143 09/29/2022 1232   K 3.7 10/23/2022 0127   CL 101 10/23/2022 0127   CO2 22 10/23/2022 0127   GLUCOSE 96 10/23/2022 0127   BUN 12 10/23/2022 0127   BUN 19 09/29/2022 1232   CREATININE 0.96 10/23/2022 0127   CALCIUM 8.7 (L) 10/23/2022 0127   EGFR 62 09/29/2022 1232   GFRNONAA >60 10/23/2022 0127    IMAGING past 24 hours CT ANGIO HEAD NECK W WO CM  Result Date: 10/22/2022 CLINICAL DATA:  Headache and visual field changes. EXAM: CT ANGIOGRAPHY HEAD AND NECK WITH AND WITHOUT CONTRAST TECHNIQUE: Multidetector CT imaging of the head and neck was performed using the standard protocol during bolus administration of intravenous contrast. Multiplanar CT image reconstructions and MIPs were obtained to evaluate the vascular anatomy. Carotid stenosis measurements (when applicable) are obtained utilizing NASCET criteria, using the distal internal carotid diameter as the denominator. RADIATION DOSE REDUCTION: This exam was performed according to the departmental dose-optimization program which includes automated  exposure control, adjustment of the mA and/or kV according to patient size and/or use of iterative reconstruction technique. CONTRAST:  75mL OMNIPAQUE IOHEXOL 350 MG/ML SOLN COMPARISON:  Head CT 10/22/2022 FINDINGS: CT HEAD FINDINGS Brain: Mild volume loss. No mass, hemorrhage or extra-axial collection. Vascular: Carotid and vertebral artery calcifications at the skull base. Skull: No acute finding. Sinuses/Orbits: Paranasal sinuses are clear. No mastoid effusion. Normal orbits. Other: None Review of the MIP images confirms the above findings CTA NECK FINDINGS Aortic  arch: Calcific aortic atherosclerosis. Normal 3 vessel branching pattern. Subclavian arteries are widely patent. Right carotid system: Moderate atherosclerotic calcification of the distal common carotid artery and carotid bifurcation resulting in approximately 40% stenosis at the ICA origin. Mild atherosclerotic calcification of the distal ICA. Left carotid system: Moderate atherosclerosis at the carotid bifurcation causing approximately 50% stenosis at the ICA origin. Mild calcification in the distal ICA. Vertebral arteries: Left dominant system. Mild multifocal calcification. There is moderate stenosis of both vertebral artery origins. Skeleton: Negative. Other neck: Negative. Upper chest: Negative Review of the MIP images confirms the above findings CTA HEAD FINDINGS Anterior circulation: --Intracranial internal carotid arteries: Atherosclerotic calcification of both internal carotid arteries at the skull base with mild stenosis of the clinoid segments. --Anterior cerebral arteries (ACA): Normal. --Middle cerebral arteries (MCA): Normal. Posterior circulation: --Vertebral arteries: Bilateral atherosclerotic calcification without significant stenosis. --Inferior cerebellar arteries: Normal. --Basilar artery: Mild narrowing of the distal basilar artery --Superior cerebellar arteries: Normal. --Posterior cerebral arteries: Multifocal atherosclerotic irregularity of the right PCA with a short segment occlusion of the proximal P5 segment (series 7, image 96). There is also multifocal irregularity of the left PCA with multiple mild stenoses but no occlusion. Venous sinuses: As permitted by contrast timing, patent. Anatomic variants: None Review of the MIP images confirms the above findings IMPRESSION: 1. Short segment occlusion of the proximal P 5 segment of the right posterior cerebral artery. 2. Multifocal mild stenosis of the left PCA. 3. Bilateral carotid bifurcation atherosclerosis with approximately 40% stenosis  on the right and 50% on the left. 4. Moderate stenosis of both vertebral artery origins. Aortic Atherosclerosis (ICD10-I70.0). Electronically Signed   By: Deatra Robinson M.D.   On: 10/22/2022 21:03    Vitals:   10/23/22 0230 10/23/22 0300 10/23/22 0330 10/23/22 0347  BP: (!) 162/96 (!) 166/90 (!) 169/88   Pulse: 70 70 71   Resp: 19 17 16    Temp:    98 F (36.7 C)  TempSrc:    Oral  SpO2: 94% 94% 94%      PHYSICAL EXAM General: Alert, elderly patient sitting in wheelchair, no acute distress Psych:  Mood and affect appropriate for situation CV: Regular rate and rhythm on monitor Respiratory:  Regular, unlabored respirations on room air GI: Abdomen soft   NEURO:  Mental Status: Alert and oriented to name only, attentive to interview.  Recent memory is severely impaired at baseline, repeats phrases/stories.  Aspects questions repeatedly. Speech/Language: Speech is somewhat tangential  Cranial Nerves:  II: Visual fields full.  III, IV, VI: EOMI. Eyelids elevate symmetrically.  V: Sensation is intact to light touch and symmetrical to face.  VII: Face is symmetrical resting and smiling VIII: hearing intact to voice. IX, X: Palate elevates symmetrically. Phonation is normal.  OZ:HYQMVHQI shrug 5/5. XII: tongue is midline without fasciculations. Motor: 5/5 strength to all muscle groups tested.  Tone: is normal and bulk is normal for his age Sensation: Intact to light touch bilaterally.  Coordination: FTN intact bilaterally  Gait: deferred  Overall: Non-focal  ASSESSMENT/PLAN  Postconcussion syndrome vs TIA with new onset blurry vision, tremors, gait disturbance in the setting of right PCA P3-P4 short-segment occlusion  Patient appears at baseline, per wife.  No focal findings on neurological exam.  Outside hospital CT head negative.  Patient has a pacemaker, assessing compatibility with MRI.  On no antiplatelet therapy at home.  Will start aspirin 81 mg daily for PCA occlusion.   PT eval to assess need for walker in the setting of continued falls.  We will continue with stroke workup.  If MRI is unavailable, we will repeat CT head at 24 hours.  LDL is 160 despite Crestor 20 mg at home, we will likely increase this before discharge.  Etiology: Almost certainly related to multiple recent falls with head trauma, no acute findings on [facility] CT head/CTA head and neck.  Moderate atherosclerosis of bilateral carotids, however embolic source is less likely in this patient with no focal findings.  Code Stroke CT head: No LVO or ICH noted. CTA head & neck: No acute process, short-segment right P5 occlusion on CTA MRI: pending 2D Echo: Not ordered LDL 160 HgbA1c awaiting A1c VTE prophylaxis - none at this time No antithrombotic prior to admission Therapy recommendations: PT eval placed, appreciate recs Disposition: Unknown  Hyperlipidemia Home meds: Crestor 20, resumed in hospital LDL 160, goal < 70 Increase to Crestor 40 before DC Continue statin at discharge  Diabetes type II Controlled (96 on admission) Home meds: None HgbA1c awaiting, goal < 7.0 CBGs SSI Recommend close follow-up with PCP for better DM control  Other Stroke Risk Factors Family hx stroke (father) Coronary artery disease Advanced age  Hospital day # 1  I have personally obtained history,examined this patient, reviewed notes, independently viewed imaging studies, participated in medical decision making and plan of care.ROS completed by me personally and pertinent positives fully documented  I have made any additions or clarifications directly to the above note. Agree with note above.  Patient with history of multiple recent falls with nonlocalizing symptoms of tremulousness and dizziness but CT angiogram showing occlusion of the distal right posterior cerebral artery in the V3-V4 segments but no clinical symptomatology to manage suggesting it may be a chronic occlusion.  MRI scan is  pending as patient has a pacemaker.  Continue ongoing stroke workup.  Start aspirin alone for stroke prevention.  Aggressive risk factor modification.  Long discussion with patient and wife at the bedside and answered questions.  Discussed with Dr.Samtani.  Greater than 50% time during this 50-minute visit was spent in counseling and coordination of care about his presentation and discussion of findings on CT angiogram and need for stroke restratification and answering questions  Delia Heady, MD Medical Director [facility] Pager: [phone] 10/23/2022 1:33 PM  To contact Stroke Continuity provider, please refer to [email or web address]. After hours, contact General Neurology

## 2022-10-23 NOTE — TOC Initial Note (Signed)
Transition of Care [facility]) - Initial/Assessment Note    Patient Details  Name: Alan [name] MRN: [id number] Date of Birth: [birth year]  [facility] CM/SW Contact:    Kermit Balo, RN Phone Number: 10/23/2022, 1:26 PM  Clinical Narrative:                 Patient has a spouse. They are together in the daytime and he is at his home alone at night. Pts son visits each evening but doesn't spend the night.  No DME at home.  Pt drives short distance. His spouse provides transportation. Pt manage his own medications and denies any issues.  Awaiting therapy evals.  TOC following.  Expected Discharge Plan:  (to be determined)     Patient Goals and CMS Choice            Expected Discharge Plan and Services   Discharge Planning Services: CM Consult   Living arrangements for the past 2 months: Single Family Home                                      Prior Living Arrangements/Services Living arrangements for the past 2 months: Single Family Home Lives with:: Self Patient language and need for interpreter reviewed:: Yes Do you feel safe going back to the place where you live?: Yes            Criminal Activity/Legal Involvement Pertinent to Current Situation/Hospitalization: No - Comment as needed  Activities of Daily Living Home Assistive Devices/Equipment: Eyeglasses, Shower chair with back ADL Screening (condition at time of admission) Patient's cognitive ability adequate to safely complete daily activities?: No Is the patient deaf or have difficulty hearing?: No Does the patient have difficulty seeing, even when wearing glasses/contacts?: No Does the patient have difficulty concentrating, remembering, or making decisions?: Yes Patient able to express need for assistance with ADLs?: Yes Does the patient have difficulty dressing or bathing?: Yes Independently performs ADLs?: Yes (appropriate for developmental age) Communication: Independent Dressing  (OT): Independent Grooming: Independent Feeding: Independent Bathing: Independent Toileting: Independent In/Out Bed: Independent Walks in Home: Independent Does the patient have difficulty walking or climbing stairs?: Yes Weakness of Legs: Both Weakness of Arms/Hands: None  Permission Sought/Granted                  Emotional Assessment Appearance:: Appears stated age   Affect (typically observed): Quiet Orientation: : Oriented to Self, Oriented to Place, Oriented to  Time, Oriented to Situation      Admission diagnosis:  Stroke-like episode [R29.90] Patient Active Problem List   Diagnosis Date Noted   Stroke-like episode 10/22/2022   Asymptomatic stenosis of intracranial artery 10/22/2022   Ground-level fall 10/22/2022   SVT (supraventricular tachycardia) 09/16/2020   Pacemaker 09/16/2020   Diabetes mellitus without complication (HCC) 12/11/2019   Major psychotic depression, recurrent (HCC) 03/03/2016   Incontinence of feces with fecal urgency 11/14/2015   Late onset Alzheimer's disease with behavioral disturbance (HCC) 09/17/2015   Malignant tumor of prostate (HCC) 03/22/2015   Renal cyst 09/17/2014   Urinary incontinence 05/24/2014   Balanitis 12/04/2013   S/P cardiac pacemaker procedure 06/14/2013   Memory loss 06/13/2013   Depression 01/18/2013   DDD (degenerative disc disease), lumbosacral 01/18/2013   Headache 01/18/2013   Organic hypersomnia 01/18/2013   Senile dementia with depression (HCC) 01/18/2013   Arrhythmia 11/08/2012   Chronotropic incompetence with sinus  node dysfunction 11/08/2012   Chest pain on exertion 07/06/2012   Male hypogonadism 04/21/2011   Peyronie's disease 04/21/2011   ED (erectile dysfunction) of organic origin 03/16/2011   Increased frequency of urination 03/16/2011   Stress incontinence (male) (male) 03/16/2011   Lightheadedness 10/14/2010   Carotid stenosis    OSA (obstructive sleep apnea) 05/05/2010   Insomnia,  psychophysiological 05/05/2010   ROSACEA 04/04/2010   HYPERGLYCEMIA 04/04/2010   TUBULOVILLOUS ADENOMA, COLON, HX OF 03/26/2010   ESOPHAGEAL STRICTURE 03/25/2010   HEARTBURN 03/25/2010   OTHER DYSPHAGIA 03/25/2010   HYPERLIPIDEMIA 07/25/2009   HYPERTENSION, BENIGN 01/08/2009   SINUS BRADYCARDIA 07/19/2008   Anxiety 07/19/2008   PERSONAL HISTORY MALIGNANT NEOPLASM PROSTATE 07/19/2008   PCP:  Paulina Fusi, MD Pharmacy:   [facility] [phone] - [place], [place] - [address] AT [address] [place] [address] Phone: [phone] Fax: [phone]     Social Determinants of Health (SDOH) Social History: SDOH Screenings   Food Insecurity: No Food Insecurity (10/23/2022)  Housing: Low Risk  (10/23/2022)  Transportation Needs: No Transportation Needs (10/23/2022)  Utilities: Not At Risk (10/23/2022)  Tobacco Use: Low Risk  (10/22/2022)   SDOH Interventions:     Readmission Risk Interventions     No data to display

## 2022-10-23 NOTE — ED Notes (Signed)
Vital signs stable.

## 2022-10-23 NOTE — Plan of Care (Signed)
Problem: Education: Goal: Knowledge of cardiac device and self-care will improve Outcome: Progressing Goal: Ability to safely manage health related needs after discharge will improve Outcome: Progressing Goal: Individualized Educational Video(s) Outcome: Progressing

## 2022-10-23 NOTE — Progress Notes (Signed)
Pt had a generator change to a MRI conditional ICD but was connected to the existing leads which are not MRI conditional. With this pt's current ICD setup we cannot safely scan him. At this time we are going to cancel the MRI.

## 2022-10-23 NOTE — Evaluation (Signed)
Physical Therapy Evaluation Patient Details Name: Alan [name] MRN: [id number] DOB: 1931-06-03 Today's Date: 10/23/2022  History of Present Illness  Patient is a 89 year old male with visual change, gait instability, and tremors. Also with ground level fall with posterior scalp hematoma and laceration of R ear. Several falls over the past few weeks. History of alzheimers type dementia, DM2, HTN, PUD   Clinical Impression  Patient is agreeable to PT evaluation. He was seated in edge of bed with spouse present at this bedside. Patient is alone at night time and spouse checks in during the day. He is ambulatory without assistive device at home.   Today, the patient is mildly impulsive with activity and requires safety cues with mobility. He is able to don/doff his own shoes prior to walking. With hallway ambulation, patient has more pronounced anterior lean with increased gait speed. Frequent cues to decrease cadence with improvement in balance noted. Encouraged patient to trial using rolling walker several times, however he declined stating he does not feel a walker is needed. He was able negotiate the steps without assistance. Recommend PT follow up to maximize independence and decrease caregiver burden.       If plan is discharge home, recommend the following: Assist for transportation;Help with stairs or ramp for entrance;Assistance with cooking/housework; Supervision due to cognition    Can travel by private vehicle        Equipment Recommendations None recommended by PT (patient refusing rolling walker)  Recommendations for Other Services       Functional Status Assessment Patient has had a recent decline in their functional status and demonstrates the ability to make significant improvements in function in a reasonable and predictable amount of time.     Precautions / Restrictions Precautions Precautions: Fall Restrictions Weight Bearing Restrictions: No      Mobility  Bed  Mobility               General bed mobility comments: not observed as patient sitting up on arrival and post session    Transfers Overall transfer level: Needs assistance Equipment used: None Transfers: Sit to/from Stand Sit to Stand: Supervision           General transfer comment: supervision for safety    Ambulation/Gait Ambulation/Gait assistance: Contact guard assist Gait Distance (Feet): 175 Feet Assistive device: None Gait Pattern/deviations: Trunk flexed, Step-through pattern       General Gait Details: fast cadence with frequent cues to decrease walking speed for safety. patient tends to have anterior lean with ambulation that is more pronounced with increased walking speed. encouraged several times to trial rolling walker, however patient politely refusing each time as he does not think a walker is needed.  Stairs Stairs: Yes Stairs assistance: Supervision Stair Management: Two rails, Alternating pattern, Forwards Number of Stairs: 3 General stair comments: patient performed stair negotiation without difficulty. no physical assistance required  Wheelchair Mobility     Tilt Bed    Modified Rankin (Stroke Patients Only)       Balance Overall balance assessment: Needs assistance Sitting-balance support: Feet supported Sitting balance-Leahy Scale: Fair Sitting balance - Comments: patient is able to don/doff shoes with no loss of balance with reaching forward and outside base of support while seated   Standing balance support: No upper extremity supported Standing balance-Leahy Scale: Fair  Pertinent Vitals/Pain Pain Assessment Pain Assessment: No/denies pain    Home Living Family/patient expects to be discharged to:: Private residence Living Arrangements: Other (Comment) (alone at night, spouse with him during the day) Available Help at Discharge: Family;Available PRN/intermittently Type of Home:  House Home Access: Stairs to enter Entrance Stairs-Rails: Left Entrance Stairs-Number of Steps: 3   Home Layout: One level Home Equipment: None      Prior Function Prior Level of Function : Independent/Modified Independent;History of Falls (last six months)             Mobility Comments: spouse drives. patient has not been using assistive device for ambulation       Extremity/Trunk Assessment   Upper Extremity Assessment Upper Extremity Assessment: Overall WFL for tasks assessed    Lower Extremity Assessment Lower Extremity Assessment: Generalized weakness    Cervical / Trunk Assessment Cervical / Trunk Assessment: Kyphotic  Communication   Communication Communication: No apparent difficulties  Cognition Arousal: Alert Behavior During Therapy: Restless, Impulsive Overall Cognitive Status: History of cognitive impairments - at baseline                                 General Comments: patient is able to follow commands with increased time. he needs safety cues with mobility with decreased awareness of deficits and need for assistance. he is impulsive with mobility        General Comments      Exercises     Assessment/Plan    PT Assessment Patient needs continued PT services  PT Problem List Decreased strength;Decreased activity tolerance;Decreased balance;Decreased mobility;Decreased safety awareness;Decreased knowledge of use of DME;Decreased cognition       PT Treatment Interventions DME instruction;Gait training;Stair training;Functional mobility training;Therapeutic activities;Therapeutic exercise;Balance training;Neuromuscular re-education;Cognitive remediation;Patient/family education    PT Goals (Current goals can be found in the Care Plan section)  Acute Rehab PT Goals Patient Stated Goal: to go home today PT Goal Formulation: With patient/family Time For Goal Achievement: 10/30/22 Potential to Achieve Goals: Good    Frequency  Min 1X/week     Co-evaluation               AM-PAC PT "6 Clicks" Mobility  Outcome Measure Help needed turning from your back to your side while in a flat bed without using bedrails?: None Help needed moving from lying on your back to sitting on the side of a flat bed without using bedrails?: A Little Help needed moving to and from a bed to a chair (including a wheelchair)?: A Little Help needed standing up from a chair using your arms (e.g., wheelchair or bedside chair)?: A Little Help needed to walk in hospital room?: A Little Help needed climbing 3-5 steps with a railing? : A Little 6 Click Score: 19    End of Session Equipment Utilized During Treatment: Gait belt Activity Tolerance: Patient tolerated treatment well Patient left: with bed alarm set;with family/visitor present (seated on bed with lunch tray set-up, spouse present) Nurse Communication: Mobility status PT Visit Diagnosis: Muscle weakness (generalized) (M62.81);Difficulty in walking, not elsewhere classified (R26.2)    Time: 1610-9604 PT Time Calculation (min) (ACUTE ONLY): 22 min   Charges:   PT Evaluation $PT Eval Moderate Complexity: 1 Mod   PT General Charges $$ ACUTE PT VISIT: 1 Visit         Donna Bernard, PT, MPT   Ina Homes 10/23/2022, 3:03 PM

## 2022-10-23 NOTE — Progress Notes (Signed)
89 year old white male radical prostatectomy for adenocarcinoma 2005 Dr. Earlene Plater SVT bradycardia followed by Dr. Graciela Husbands with a [employer] Accolade PPM replaced 06/17/2022 Alzheimer's which is moderate to severe Borderline diabetes mellitus Peptic ulcer disease with dysphagia stricture  Several falls over ~ 2 mo, some outside in the yard.  Lives essentially alone--they have 2 homes. Married to 2nd wife for 24 years Started having cognition issues ~ 7 yr ago Only change to meds was Amiodarone which was initiated recently by Dr. Miguel Aschoff hit his head 9/12 at around 0 500 AM with new onset blurry vision tremors and gait disturbance while on the way to routine cardiology appointment-Dr. Graciela Husbands and Dr. Anne Fu saw the patient they did an EKG neuroexam was negative he was transported to [facility] and then subsequently at around 1440 brought to [facility]  BP (!) 169/88   Pulse 71   Temp 98 F (36.7 C) (Oral)   Resp 16   SpO2 94%  Awake some coherent remembers some fact of his fall--then starts to ramble--wife bedside says he has "good and bad days" No ict no pallor Laceration R ear S1 s2 irreg HR- Abd soft No edema Refuses to smile Vision by direct confrontationnintact Motor 5/5 grossly--DTR equivocal   EMS was called CTh CTA no hemorrhage no large stroke however R PCA P3-P4 short segment occlusion Neurology saw the patient in consult recommended MRI brain without contrast need to be arranged with pacemaker Was started on aspirin 81 For right ear lobe laceration he had sutures placed at the outside hospital  We await decision re: CTA vs other imaging as PPM not MRI compatible PPM to be interrogated  I would consider stopping Aricept give arrythmias--will ask Dr. Graciela Husbands to see  Given his falls he will almost certainly need some rehab--he might not agree acc to wife  Pleas Koch, MD [facility] 8:11 AM

## 2022-10-24 DIAGNOSIS — R2990 Unspecified symptoms and signs involving the nervous system: Secondary | ICD-10-CM | POA: Diagnosis not present

## 2022-10-24 MED ORDER — ALPRAZOLAM 0.25 MG PO TABS: 0.2500 mg | ORAL_TABLET | Freq: Every day | ORAL | Status: DC

## 2022-10-24 MED ORDER — MIRTAZAPINE 7.5 MG PO TABS: 7.5000 mg | ORAL_TABLET | Freq: Every day | ORAL | 1 refills | Status: AC

## 2022-10-24 MED ORDER — ASPIRIN 81 MG PO TBEC: 81.0000 mg | DELAYED_RELEASE_TABLET | Freq: Every day | ORAL | 12 refills | Status: AC

## 2022-10-24 MED ORDER — FUROSEMIDE 20 MG PO TABS
20.0000 mg | ORAL_TABLET | Freq: Every day | ORAL | Status: DC
  Administered 2022-10-24: 20 mg via ORAL
  Filled 2022-10-24: qty 1

## 2022-10-24 NOTE — Progress Notes (Signed)
STROKE TEAM PROGRESS NOTE   BRIEF HPI Alan [name] is a 89 y.o. male with history of  PMH significant for alzheimers type dementia, DM2, HTN, PUD, SVT and bradycardia with pacemaker, who presents with fall this AM, episode of tremulousness, blurred vision in the afternoon.   EMS called and he was brought in to the ED where [facility] and CTA with no hemorrhage, no large stroke. CTA notable for R PCA P3-4 short segment occlusion.    SIGNIFICANT HOSPITAL EVENTS 9/12: CT H/CTA negative.  Short segment occlusion of the right distal posterior cerebral artery pending MRI. 9/13: Transferred to floor.  INTERIM HISTORY/SUBJECTIVE Wife at the bedside. Pt reclining in bed, just finished lunch. No acute distress, no tremor or agitation. Not orientated. Fluent language but lack of content and "sentence salad", however, able to repeat and name. Limited memory, can not recall his fall events or his walking with PT OT today. Pt not using walker at home, discussed with wife that pt will need walker to walk at home to avoid fall as able.   OBJECTIVE  CBC    Component Value Date/Time   WBC 6.1 10/23/2022 0127   RBC 4.06 (L) 10/23/2022 0127   HGB 12.7 (L) 10/23/2022 0127   HGB 14.2 06/03/2022 1425   HCT 38.6 (L) 10/23/2022 0127   HCT 42.1 06/03/2022 1425   PLT 186 10/23/2022 0127   PLT 209 06/03/2022 1425   MCV 95.1 10/23/2022 0127   MCV 95 06/03/2022 1425   MCH 31.3 10/23/2022 0127   MCHC 32.9 10/23/2022 0127   RDW 14.0 10/23/2022 0127   RDW 12.6 06/03/2022 1425   LYMPHSABS 0.7 10/22/2022 1819   MONOABS 0.6 10/22/2022 1819   EOSABS 0.1 10/22/2022 1819   BASOSABS 0.1 10/22/2022 1819    BMET    Component Value Date/Time   NA 136 10/23/2022 0127   NA 143 09/29/2022 1232   K 3.7 10/23/2022 0127   CL 101 10/23/2022 0127   CO2 22 10/23/2022 0127   GLUCOSE 96 10/23/2022 0127   BUN 12 10/23/2022 0127   BUN 19 09/29/2022 1232   CREATININE 0.96 10/23/2022 0127   CALCIUM 8.7 (L) 10/23/2022 0127    EGFR 62 09/29/2022 1232   GFRNONAA >60 10/23/2022 0127    IMAGING past 24 hours CT HEAD WO CONTRAST ( )  Result Date: 10/24/2022 CLINICAL DATA:  Stroke suspected EXAM: CT HEAD WITHOUT CONTRAST TECHNIQUE: Contiguous axial images were obtained from the base of the skull through the vertex without intravenous contrast. RADIATION DOSE REDUCTION: This exam was performed according to the departmental dose-optimization program which includes automated exposure control, adjustment of the mA and/or kV according to patient size and/or use of iterative reconstruction technique. COMPARISON:  10/22/2022 FINDINGS: Brain: No mass, hemorrhage or extra-axial collection. There is generalized volume loss. There is hypoattenuation of the bilateral supratentorial white matter. Vascular: Skull base atherosclerosis. Skull: Normal Sinuses/Orbits: Paranasal sinuses are clear. No mastoid effusion. Normal orbits. Other: None IMPRESSION: 1. No acute intracranial abnormality. 2. Generalized volume loss and chronic small vessel disease. Electronically Signed   By: Deatra Robinson M.D.   On: 10/24/2022 02:58    Vitals:   10/23/22 2347 10/24/22 0359 10/24/22 0737 10/24/22 1157  BP: (!) 141/77 (!) 173/75 (!) 166/97 (!) 143/79  Pulse: 70 75 71 74  Resp: 16 17 18 18   Temp: 98.4 F (36.9 C) (!) 97.4 F (36.3 C) 98.2 F (36.8 C) 98.3 F (36.8 C)  TempSrc: Oral Oral Oral Oral  SpO2: 97% 95% 95% 95%  Weight:      Height:         PHYSICAL EXAM General: Alert, elderly patient sitting in wheelchair, no acute distress Psych:  Mood and affect appropriate for situation CV: Regular rate and rhythm on monitor Respiratory:  Regular, unlabored respirations on room air GI: Abdomen soft  Neuro - awake, alert, eyes spontaneously open, not orientated to age, place, time, but orientated to his wife. "Short sentence salad", not meaningful conversation, but following all simple commands. Able to name and repeat. No gaze palsy, tracking  bilaterally, visual field full. No facial droop. Tongue midline. Bilateral UEs 4/5, no drift. Bilaterally LEs 3/5. Sensation symmetrical bilaterally, b/l FTN intact grossly, gait not tested.     ASSESSMENT/PLAN  Gait disorder with falls  Encephalopathy vs. AD with behavioral disturbance Intermittent impulsiveness and aggressive towards wife Several falls recently but not using walker at home Code Stroke CT head: No LVO or ICH noted. CTA head & neck short-segment right P3/4 occlusion, right ICA 40% and left ICA 50% proximal stenosis, b/l VA origin moderate stenosis MRI - pacer not compatible with MRI CT repeat no acute finding Pacemaker interrogation on afib per Dr. Mahala Menghini  2D Echo recommend to perform as outpt with Dr. Graciela Husbands LDL 160 HgbA1c 6.0 VTE prophylaxis - lovenox No antithrombotic prior to admission, now on ASA 81  Therapy recommendations: HH PT Disposition: home today with walker  Hypertension  BP stable Long term BP goal normotensive  Hyperlipidemia Home meds: Crestor 20, resumed in hospital LDL 160, goal < 70 Increase to Crestor 40  Continue statin at discharge  Diabetes type II Controlled  Home meds: None HgbA1c 6.0, goal < 7.0 CBGs SSI Recommend close follow-up with PCP for better DM control  Other Stroke Risk Factors Family hx stroke (father) Coronary artery disease Advanced age  Other acute issues Alzheimer's disease - advanced SVT on amiodarone Bradycardia s/p pacemaker PUD  Hospital day # 2  Neurology will sign off. Please call with questions. Pt will follow up with Dr. Terrace Arabia at [facility] in about 4 weeks. Thanks for the consult.  Marvel Plan, MD PhD Stroke Neurology 10/24/2022 7:09 PM   To contact Stroke Continuity provider, please refer to [email or web address]. After hours, contact General Neurology

## 2022-10-24 NOTE — Discharge Summary (Signed)
Physician Discharge Summary  Alan [name] XBM:[id number] DOB: 07-13-1931 DOA: 10/22/2022  PCP: Paulina Fusi, MD  Admit date: 10/22/2022 Discharge date: 10/24/2022  Time spent: 27 minutes  Recommendations for Outpatient Follow-up:  Requires outpatient echocardiogram to be scheduled with his cardiologist Dr. Jacklynn Ganong I will CC both of them Needs Chem-12 CBC in about 1 week Recommend discontinuation of Aricept in the outpatient Does require removal of sutures from laceration of ear in about 10 days probably on 9/24  Discharge Diagnoses:  MAIN problem for hospitalization   Falls  Please see below for itemized issues addressed in HOpsital- refer to other progress notes for clarity if needed  Discharge Condition: Improved  Diet recommendation: Heart healthy  Filed Weights   10/23/22 0829  Weight: 68 kg    History of present illness:  89 year old white male radical prostatectomy for adenocarcinoma 2005 Dr. Earlene Plater SVT bradycardia followed by Dr. Graciela Husbands with a [employer] Accolade PPM replaced 06/17/2022 Alzheimer's which is moderate to severe Borderline diabetes mellitus Peptic ulcer disease with dysphagia stricture   Several falls over ~ 2 mo, some outside in the yard.  Lives essentially alone--they have 2 homes. Married to 2nd wife for 24 years Started having cognition issues ~ 7 yr ago Only change to meds was Amiodarone which was initiated recently by Dr. Miguel Aschoff hit his head 9/12 at around 0 500 AM with new onset blurry vision tremors and gait disturbance while on the way to routine cardiology appointment-Dr. Graciela Husbands and Dr. Anne Fu saw the patient they did an EKG neuroexam was negative he was transported to [facility] and then subsequently at around 1440 brought to [facility]  EMS was called and CT angio head showed no hemorrhage small occlusion MRI brain could not be performed on arrival because of MRI incompatible leads Started on aspirin Had a right  ear laceration that was repaired  Patient had repeat CT at the request of neurology on 9/14 which is negative for stroke Echocardiogram per Dr. Roda Shutters offneurology can be performed as an outpatient can be performed  Patient requires home health and a walker and we ordered both and patient was discharged in a stable state on aspirin 81 and cautioned with regards to getting follow-up in the outpatient setting  Would recommend discontinuation of Aricept if continues to have issues with weird falls    Discharge Exam: Vitals:   10/24/22 0737 10/24/22 1157  BP: (!) 166/97 (!) 143/79  Pulse: 71 74  Resp: 18 18  Temp: 98.2 F (36.8 C) 98.3 F (36.8 C)  SpO2: 95% 95%    Subj on day of d/c   Awake coherent no distress looks comfortable sitting up less confused S1-S2 no murmur Abdomen soft no rebound ROM intact moving all 4 limbs equally power 5/5  Discharge Instructions   Discharge Instructions     Diet - low sodium heart healthy   Complete by: As directed    Increase activity slowly   Complete by: As directed       Allergies as of 10/24/2022       Reactions   Cefdinir Other (See Comments)   confusion   Effexor [venlafaxine] Other (See Comments)   "extremely dizzy"   Sulfa Antibiotics Other (See Comments), Swelling   Amlodipine Besylate Swelling   Swelling of scrotum   Aliskiren Other (See Comments)   Lower back pain   Sulfamethoxazole    throat swelled   Tekturna Hct [aliskiren-hydrochlorothiazide]    Lower back  pain        Medication List     STOP taking these medications    loperamide 2 MG tablet Commonly known as: IMODIUM A-D       TAKE these medications    alclomethasone 0.05 % ointment Commonly known as: ACLOVATE Apply 1 application  topically daily.   ALPRAZolam 0.25 MG tablet Commonly known as: XANAX Take 0.25 mg by mouth at bedtime. May take another tablet by mouth if awakens during the night   amiodarone 200 MG tablet Commonly known as:  PACERONE Take one tablet by mouth twice daily for 1 weeks, then take one tablet by mouth once daily thereafter   ascorbic acid 500 MG tablet Commonly known as: VITAMIN C Take 500 mg by mouth 2 (two) times daily.   aspirin EC 81 MG tablet Take 1 tablet (81 mg total) by mouth daily. Swallow whole. Start taking on: October 25, 2022   b complex vitamins tablet Take 1 tablet by mouth daily with lunch.   B-12 PO Take 1 capsule by mouth daily in the afternoon.   CINNAMON PO Take 1 tablet by mouth 2 (two) times daily.   COQ-10 PO Take 1 tablet by mouth daily with supper.   Crestor 20 MG tablet Generic drug: rosuvastatin Take 20 mg by mouth daily with supper.   donepezil 10 MG tablet Commonly known as: ARICEPT Take 10 mg by mouth daily.   furosemide 20 MG tablet Commonly known as: LASIX Take 20 mg by mouth daily.   GARLIC PO Take 1 tablet by mouth daily.   mirtazapine 7.5 MG tablet Commonly known as: REMERON Take 1 tablet (7.5 mg total) by mouth at bedtime. What changed:  medication strength how much to take   OMEGA 3 PO Take 1 capsule by mouth daily.   OTRIVIN NA Place 2 sprays into both nostrils daily as needed (nasal congestion). Nasal spray as needed twice daily   PEPCID PO Take 1 tablet by mouth daily as needed (pain).   Vitamin D 50 MCG (2000 UT) tablet Take 2,000 Units by mouth daily in the afternoon.   Zinc 25 MG Tabs Take 25 mg by mouth daily in the afternoon.   Zinc Oxide 12.8 % ointment Commonly known as: TRIPLE PASTE Apply 1 Application topically at bedtime as needed (when using mupirocin).               Durable Medical Equipment  (From admission, onward)           Start     Ordered   10/24/22 1527  For home use only DME Walker rolling  Once       Question Answer Comment  Walker: With 5 Inch Wheels   Patient needs a walker to treat with the following condition Weakness      10/24/22 1527          Presenting pain Januvia  ordered Allergies  Allergen Reactions   Cefdinir Other (See Comments)    confusion   Effexor [Venlafaxine] Other (See Comments)    "extremely dizzy"   Sulfa Antibiotics Other (See Comments) and Swelling   Amlodipine Besylate Swelling    Swelling of scrotum    Aliskiren Other (See Comments)    Lower back pain   Sulfamethoxazole     throat swelled   Tekturna Hct [Aliskiren-Hydrochlorothiazide]     Lower back pain      The results of significant diagnostics from this hospitalization (including imaging, microbiology, ancillary and laboratory) are  listed below for reference.    Significant Diagnostic Studies: CT HEAD WO CONTRAST ( )  Result Date: 10/24/2022 CLINICAL DATA:  Stroke suspected EXAM: CT HEAD WITHOUT CONTRAST TECHNIQUE: Contiguous axial images were obtained from the base of the skull through the vertex without intravenous contrast. RADIATION DOSE REDUCTION: This exam was performed according to the departmental dose-optimization program which includes automated exposure control, adjustment of the mA and/or kV according to patient size and/or use of iterative reconstruction technique. COMPARISON:  10/22/2022 FINDINGS: Brain: No mass, hemorrhage or extra-axial collection. There is generalized volume loss. There is hypoattenuation of the bilateral supratentorial white matter. Vascular: Skull base atherosclerosis. Skull: Normal Sinuses/Orbits: Paranasal sinuses are clear. No mastoid effusion. Normal orbits. Other: None IMPRESSION: 1. No acute intracranial abnormality. 2. Generalized volume loss and chronic small vessel disease. Electronically Signed   By: Deatra Robinson M.D.   On: 10/24/2022 02:58   CT ANGIO HEAD NECK W WO CM  Result Date: 10/22/2022 CLINICAL DATA:  Headache and visual field changes. EXAM: CT ANGIOGRAPHY HEAD AND NECK WITH AND WITHOUT CONTRAST TECHNIQUE: Multidetector CT imaging of the head and neck was performed using the standard protocol during bolus administration  of intravenous contrast. Multiplanar CT image reconstructions and MIPs were obtained to evaluate the vascular anatomy. Carotid stenosis measurements (when applicable) are obtained utilizing NASCET criteria, using the distal internal carotid diameter as the denominator. RADIATION DOSE REDUCTION: This exam was performed according to the departmental dose-optimization program which includes automated exposure control, adjustment of the mA and/or kV according to patient size and/or use of iterative reconstruction technique. CONTRAST:  75mL OMNIPAQUE IOHEXOL 350 MG/ML SOLN COMPARISON:  Head CT 10/22/2022 FINDINGS: CT HEAD FINDINGS Brain: Mild volume loss. No mass, hemorrhage or extra-axial collection. Vascular: Carotid and vertebral artery calcifications at the skull base. Skull: No acute finding. Sinuses/Orbits: Paranasal sinuses are clear. No mastoid effusion. Normal orbits. Other: None Review of the MIP images confirms the above findings CTA NECK FINDINGS Aortic arch: Calcific aortic atherosclerosis. Normal 3 vessel branching pattern. Subclavian arteries are widely patent. Right carotid system: Moderate atherosclerotic calcification of the distal common carotid artery and carotid bifurcation resulting in approximately 40% stenosis at the ICA origin. Mild atherosclerotic calcification of the distal ICA. Left carotid system: Moderate atherosclerosis at the carotid bifurcation causing approximately 50% stenosis at the ICA origin. Mild calcification in the distal ICA. Vertebral arteries: Left dominant system. Mild multifocal calcification. There is moderate stenosis of both vertebral artery origins. Skeleton: Negative. Other neck: Negative. Upper chest: Negative Review of the MIP images confirms the above findings CTA HEAD FINDINGS Anterior circulation: --Intracranial internal carotid arteries: Atherosclerotic calcification of both internal carotid arteries at the skull base with mild stenosis of the clinoid segments.  --Anterior cerebral arteries (ACA): Normal. --Middle cerebral arteries (MCA): Normal. Posterior circulation: --Vertebral arteries: Bilateral atherosclerotic calcification without significant stenosis. --Inferior cerebellar arteries: Normal. --Basilar artery: Mild narrowing of the distal basilar artery --Superior cerebellar arteries: Normal. --Posterior cerebral arteries: Multifocal atherosclerotic irregularity of the right PCA with a short segment occlusion of the proximal P5 segment (series 7, image 96). There is also multifocal irregularity of the left PCA with multiple mild stenoses but no occlusion. Venous sinuses: As permitted by contrast timing, patent. Anatomic variants: None Review of the MIP images confirms the above findings IMPRESSION: 1. Short segment occlusion of the proximal P 5 segment of the right posterior cerebral artery. 2. Multifocal mild stenosis of the left PCA. 3. Bilateral carotid bifurcation atherosclerosis with approximately 40% stenosis on  the right and 50% on the left. 4. Moderate stenosis of both vertebral artery origins. Aortic Atherosclerosis (ICD10-I70.0). Electronically Signed   By: Deatra Robinson M.D.   On: 10/22/2022 21:03    Microbiology: No results found for this or any previous visit (from the past 240 hour(s)).   Labs: Basic Metabolic Panel: Recent Labs  Lab 10/22/22 1819 10/23/22 0127  NA 136 136  K 4.0 3.7  CL 100 101  CO2 25 22  GLUCOSE 105* 96  BUN 15 12  CREATININE 1.09 0.96  CALCIUM 8.7* 8.7*   Liver Function Tests: Recent Labs  Lab 10/22/22 1819  AST 30  ALT 19  ALKPHOS 73  BILITOT 0.7  PROT 5.9*  ALBUMIN 3.4*   No results for input(s): "LIPASE", "AMYLASE" in the last 168 hours. No results for input(s): "AMMONIA" in the last 168 hours. CBC: Recent Labs  Lab 10/22/22 1819 10/23/22 0127  WBC 6.9 6.1  NEUTROABS 5.4  --   HGB 12.0* 12.7*  HCT 37.2* 38.6*  MCV 97.1 95.1  PLT 199 186   Cardiac Enzymes: No results for input(s):  "CKTOTAL", "CKMB", "CKMBINDEX", "TROPONINI" in the last 168 hours. BNP: BNP (last 3 results) No results for input(s): "BNP" in the last 8760 hours.  ProBNP (last 3 results) No results for input(s): "PROBNP" in the last 8760 hours.  CBG: No results for input(s): "GLUCAP" in the last 168 hours.     Signed:  Rhetta Mura MD   [facility] 10/24/2022, 5:19 PM

## 2022-10-24 NOTE — Progress Notes (Signed)
Physical Therapy Treatment Patient Details Name: Alan [name] MRN: [id number] DOB: [birth year] Today's Date: 10/24/2022   History of Present Illness Patient is a 89 year old male with visual change, gait instability, and tremors. Also with ground level fall with posterior scalp hematoma and laceration of R ear. Several falls over the past few weeks. History of alzheimers type dementia, DM2, HTN, PUD    PT Comments  Pt continues to be impulsive with decreased safety awareness. He also continues to demonstrate a fast cadence, often resulting in increased instability and occasional LOB laterally and need for minA to recover. Pt was more receptive to education on his high risk for falls and trying to use the RW today, demonstrating improved stability and safety when utilizing the RW for standing mobility. Wife plans to take pt home with her to provide close to 24/7 care. Encouraged guarding pt with all standing mobility and use of RW. Provided them with a gait belt. They verbalized understanding.      If plan is discharge home, recommend the following: Assist for transportation;Help with stairs or ramp for entrance;Assistance with cooking/housework;Supervision due to cognitive status;A little help with walking and/or transfers;A little help with bathing/dressing/bathroom   Can travel by private vehicle        Equipment Recommendations  Rolling walker (2 wheels)    Recommendations for Other Services       Precautions / Restrictions Precautions Precautions: Fall Restrictions Weight Bearing Restrictions: No     Mobility  Bed Mobility               General bed mobility comments: not observed as patient sitting up on arrival and post session    Transfers Overall transfer level: Needs assistance Equipment used: None Transfers: Sit to/from Stand Sit to Stand: Contact guard assist           General transfer comment: Pt unsteady coming to stand from EOB with pants around his  thighs, placing L hand on bed rail and leaning legs against bed for support, CGA for safety    Ambulation/Gait Ambulation/Gait assistance: Contact guard assist, Min assist Gait Distance (Feet): 350 Feet Assistive device: None, Rolling walker (2 wheels) Gait Pattern/deviations: Trunk flexed, Step-through pattern, Decreased stride length, Staggering right, Staggering left   Gait velocity interpretation: >2.62 ft/sec, indicative of community ambulatory   General Gait Details: fast cadence with frequent cues to decrease walking speed for safety. patient tends to have anterior lean with ambulation that is more pronounced with increased walking speed. x1 LOB to L as he began to walk fast in hall, minA to recover. x1 LOB laterally when cued to turn head L <> R, minA to recover. Pt eventually agreeable to trying RW with improved stability and no LOB when turning head L <> R, CGA for safety. Cues provided for proximity to RW and keeping feet within RW when ambulating, good success noted   Stairs             Wheelchair Mobility     Tilt Bed    Modified Rankin (Stroke Patients Only)       Balance Overall balance assessment: Needs assistance Sitting-balance support: Feet supported Sitting balance-Leahy Scale: Fair     Standing balance support: No upper extremity supported, Bilateral upper extremity supported Standing balance-Leahy Scale: Fair Standing balance comment: LOB bouts needing minA to recover at times, benefits from RW  Cognition Arousal: Alert Behavior During Therapy: Impulsive Overall Cognitive Status: History of cognitive impairments - at baseline                                 General Comments: patient is able to follow commands with increased time. he needs safety cues with mobility with decreased awareness of deficits and need for assistance. he is impulsive with mobility. more receptive to cues and suggestions  today        Exercises      General Comments General comments (skin integrity, edema, etc.): wife plans to take pt home with her to provide close to 24/7 care, encouraged guarding pt with all standing mobility and use of RW and provided them with gait belt, they verbalized understanding      Pertinent Vitals/Pain Pain Assessment Pain Assessment: Faces Faces Pain Scale: No hurt Pain Intervention(s): Monitored during session    Home Living                          Prior Function            PT Goals (current goals can now be found in the care plan section) Acute Rehab PT Goals Patient Stated Goal: to go home today PT Goal Formulation: With patient/family Time For Goal Achievement: 10/30/22 Potential to Achieve Goals: Good Progress towards PT goals: Progressing toward goals    Frequency    Min 1X/week      PT Plan      Co-evaluation              AM-PAC PT "6 Clicks" Mobility   Outcome Measure  Help needed turning from your back to your side while in a flat bed without using bedrails?: None Help needed moving from lying on your back to sitting on the side of a flat bed without using bedrails?: A Little Help needed moving to and from a bed to a chair (including a wheelchair)?: A Little Help needed standing up from a chair using your arms (e.g., wheelchair or bedside chair)?: A Little Help needed to walk in hospital room?: A Little Help needed climbing 3-5 steps with a railing? : A Little 6 Click Score: 19    End of Session Equipment Utilized During Treatment: Gait belt Activity Tolerance: Patient tolerated treatment well Patient left: with bed alarm set;with family/visitor present;in bed;with call bell/phone within reach (seated on bed, spouse present)   PT Visit Diagnosis: Muscle weakness (generalized) (M62.81);Difficulty in walking, not elsewhere classified (R26.2);Unsteadiness on feet (R26.81);Other abnormalities of gait and mobility (R26.89)      Time: 1027-2536 PT Time Calculation (min) (ACUTE ONLY): 18 min  Charges:    $Gait Training: 8-22 mins PT General Charges $$ ACUTE PT VISIT: 1 Visit                     Virgil Benedict, PT, DPT Acute Rehabilitation Services  Office: [phone]    Bettina Gavia 10/24/2022, 5:44 PM

## 2022-10-24 NOTE — Progress Notes (Signed)
Discharge instructions reviewed with patient and his wife. Alan [name], the wife clearly understood the instructions for the pt. IV removed and pt wheeled down by NT.

## 2022-10-24 NOTE — TOC Transition Note (Signed)
Transition of Care [facility]) - CM/SW Discharge Note   Patient Details  Name: Alan [name] MRN: [id number] Date of Birth: [birth year]  [facility]) CM/SW Contact:  Lawerance Sabal, RN Phone Number: 10/24/2022, 3:52 PM   Clinical Narrative:     RW to be delivered to the room by [facility] for DC.  Spoke w patient and wife at bedside.  They reviewed medicare ratings and chose [facility]. Referral accepted, No other TOC needs identified at this time  Final next level of care: Home w Home Health Services Barriers to Discharge: No Barriers Identified   Patient Goals and CMS Choice CMS Medicare.gov Compare Post Acute Care list provided to:: Patient Choice offered to / list presented to : Patient  Discharge Placement                         Discharge Plan and Services Additional resources added to the After Visit Summary for     Discharge Planning Services: CM Consult            DME Arranged: Dan Humphreys rolling DME Agency: [employer] Date DME Agency Contacted: 10/24/22 Time DME Agency Contacted: [phone] Representative spoke with at DME Agency: Vaughan Basta HH Arranged: PT, OT HH Agency: [facility] Date [facility] Agency Contacted: 10/24/22 Time HH Agency Contacted: [phone] Representative spoke with at [facility] Agency: Elnita Maxwell  Social Determinants of Health (SDOH) Interventions SDOH Screenings   Food Insecurity: No Food Insecurity (10/23/2022)  Housing: Low Risk  (10/23/2022)  Transportation Needs: No Transportation Needs (10/23/2022)  Utilities: Not At Risk (10/23/2022)  Tobacco Use: Low Risk  (10/22/2022)     Readmission Risk Interventions     No data to display

## 2022-11-02 ENCOUNTER — Telehealth: Payer: Self-pay

## 2022-11-02 NOTE — Telephone Encounter (Signed)
Spoke with pt's wife, who reports pt has been in [facility] x 7 days due to Covid.  She reports pt coded and required CPR.  Pt's wife states pt was advised to have outpatient echo after his discharge from [facility].Marland Kitchen  Pt's wife is wondering if Dr Graciela Husbands could order the testing while pt is in the hospital.  Advised echo order would be up to pt's supervising physicians at the hospital as [facility] is not a part of [facility].  Explained to pt's wife the most important thing is for pt to heal from Covid and effects of CPR.  She states pt will be going to rehab once released from the hospital.  Advised pt may follow up with [facility] once released from rehab.  Pt's wife verbalizes understanding and thanked [employer] fundraiser for the call.

## 2022-11-02 NOTE — Telephone Encounter (Signed)
Pt wife called in wanting to know if [facility] can put a order in for a ultrasound that he was suppose to get. He is currently in the hospital and wants to know if this is something he can get done there. Pt wife would like a call back

## 2022-12-18 ENCOUNTER — Ambulatory Visit (INDEPENDENT_AMBULATORY_CARE_PROVIDER_SITE_OTHER): Payer: Medicare Other

## 2022-12-18 DIAGNOSIS — I495 Sick sinus syndrome: Secondary | ICD-10-CM | POA: Diagnosis not present

## 2022-12-18 LAB — CUP PACEART REMOTE DEVICE CHECK
Battery Remaining Longevity: 102 mo
Battery Remaining Percentage: 100 %
Brady Statistic RA Percent Paced: 80 %
Brady Statistic RV Percent Paced: 0 %
Date Time Interrogation Session: 20241108010100
Lead Channel Impedance Value: 447 Ohm
Lead Channel Impedance Value: 929 Ohm
Lead Channel Pacing Threshold Amplitude: 0.7 V
Lead Channel Pacing Threshold Amplitude: 0.8 V
Lead Channel Pacing Threshold Pulse Width: 0.4 ms
Lead Channel Pacing Threshold Pulse Width: 0.4 ms
Lead Channel Setting Pacing Amplitude: 2 V
Lead Channel Setting Pacing Amplitude: 2.4 V
Lead Channel Setting Pacing Pulse Width: 0.4 ms
Lead Channel Setting Sensing Sensitivity: 2.5 mV
Pulse Gen Serial Number: 776986
Zone Setting Status: 755011

## 2022-12-29 NOTE — Progress Notes (Signed)
Remote pacemaker transmission.   

## 2023-02-22 ENCOUNTER — Telehealth: Payer: Self-pay | Admitting: Internal Medicine

## 2023-02-22 NOTE — Telephone Encounter (Signed)
Wife states she was returning call. Please advise  

## 2023-02-22 NOTE — Telephone Encounter (Signed)
Lm to call back ./cy 

## 2023-02-24 NOTE — Telephone Encounter (Signed)
 Spoke with pt's wife, DPR who states she received a call from office number but was unable to pick up the phone in time.  Pt's wife RN does not see where anyone from office tried to contact pt.  Pt's wife verbalizes understanding and thanked [employer] fundraiser for the call.

## 2023-03-19 ENCOUNTER — Ambulatory Visit (INDEPENDENT_AMBULATORY_CARE_PROVIDER_SITE_OTHER): Payer: Medicare Other

## 2023-03-19 DIAGNOSIS — I495 Sick sinus syndrome: Secondary | ICD-10-CM | POA: Diagnosis not present

## 2023-03-26 LAB — CUP PACEART REMOTE DEVICE CHECK
Battery Remaining Longevity: 102 mo
Battery Remaining Percentage: 100 %
Brady Statistic RA Percent Paced: 88 %
Brady Statistic RV Percent Paced: 0 %
Date Time Interrogation Session: 20250207010100
Lead Channel Impedance Value: 1051 Ohm
Lead Channel Impedance Value: 479 Ohm
Lead Channel Pacing Threshold Amplitude: 0.8 V
Lead Channel Pacing Threshold Amplitude: 0.9 V
Lead Channel Pacing Threshold Pulse Width: 0.4 ms
Lead Channel Pacing Threshold Pulse Width: 0.4 ms
Lead Channel Setting Pacing Amplitude: 2 V
Lead Channel Setting Pacing Amplitude: 2.4 V
Lead Channel Setting Pacing Pulse Width: 0.4 ms
Lead Channel Setting Sensing Sensitivity: 2.5 mV
Pulse Gen Serial Number: 776986
Zone Setting Status: 755011

## 2023-04-20 NOTE — Progress Notes (Signed)
 Remote pacemaker transmission.

## 2023-04-20 NOTE — Addendum Note (Signed)
 Addended by: Elease Etienne A on: 04/20/2023 11:22 AM   Modules accepted: Orders

## 2023-06-18 ENCOUNTER — Ambulatory Visit (INDEPENDENT_AMBULATORY_CARE_PROVIDER_SITE_OTHER): Payer: Medicare Other

## 2023-06-18 DIAGNOSIS — I495 Sick sinus syndrome: Secondary | ICD-10-CM | POA: Diagnosis not present

## 2023-06-18 LAB — CUP PACEART REMOTE DEVICE CHECK
Battery Remaining Longevity: 96 mo
Battery Remaining Percentage: 100 %
Brady Statistic RA Percent Paced: 90 %
Brady Statistic RV Percent Paced: 0 %
Date Time Interrogation Session: 20250509010100
Lead Channel Impedance Value: 446 Ohm
Lead Channel Impedance Value: 955 Ohm
Lead Channel Pacing Threshold Amplitude: 0.9 V
Lead Channel Pacing Threshold Amplitude: 1 V
Lead Channel Pacing Threshold Pulse Width: 0.4 ms
Lead Channel Pacing Threshold Pulse Width: 0.4 ms
Lead Channel Setting Pacing Amplitude: 2 V
Lead Channel Setting Pacing Amplitude: 2.4 V
Lead Channel Setting Pacing Pulse Width: 0.4 ms
Lead Channel Setting Sensing Sensitivity: 2.5 mV
Pulse Gen Serial Number: 776986
Zone Setting Status: 755011

## 2023-07-07 ENCOUNTER — Ambulatory Visit: Payer: Self-pay | Admitting: Cardiology

## 2023-07-23 NOTE — Progress Notes (Signed)
 Remote pacemaker transmission.

## 2023-09-17 ENCOUNTER — Ambulatory Visit: Payer: Medicare Other

## 2023-09-17 DIAGNOSIS — I495 Sick sinus syndrome: Secondary | ICD-10-CM

## 2023-09-17 LAB — CUP PACEART REMOTE DEVICE CHECK
Battery Remaining Longevity: 96 mo
Battery Remaining Percentage: 100 %
Brady Statistic RA Percent Paced: 92 %
Brady Statistic RV Percent Paced: 1 %
Date Time Interrogation Session: 20250808010300
Lead Channel Impedance Value: 1021 Ohm
Lead Channel Impedance Value: 475 Ohm
Lead Channel Pacing Threshold Amplitude: 0.7 V
Lead Channel Pacing Threshold Amplitude: 0.9 V
Lead Channel Pacing Threshold Pulse Width: 0.4 ms
Lead Channel Pacing Threshold Pulse Width: 0.4 ms
Lead Channel Setting Pacing Amplitude: 2 V
Lead Channel Setting Pacing Amplitude: 2.4 V
Lead Channel Setting Pacing Pulse Width: 0.4 ms
Lead Channel Setting Sensing Sensitivity: 2.5 mV
Pulse Gen Serial Number: 776986
Zone Setting Status: 755011

## 2023-11-05 NOTE — Progress Notes (Signed)
 Remote PPM Transmission

## 2023-12-02 ENCOUNTER — Telehealth: Payer: Self-pay | Admitting: Student

## 2023-12-02 NOTE — Telephone Encounter (Signed)
 FYI Wife calling saying pt is in a facility and did he need to come in for a Pacemaker check. I called Shayna at [facility] and said he didn't have to come in

## 2023-12-04 ENCOUNTER — Ambulatory Visit: Payer: Self-pay | Admitting: Cardiology

## 2023-12-17 ENCOUNTER — Ambulatory Visit: Payer: Medicare Other

## 2023-12-17 DIAGNOSIS — I495 Sick sinus syndrome: Secondary | ICD-10-CM | POA: Diagnosis not present

## 2023-12-19 ENCOUNTER — Ambulatory Visit: Payer: Self-pay | Admitting: Student in an Organized Health Care Education/Training Program

## 2023-12-19 LAB — CUP PACEART REMOTE DEVICE CHECK
Battery Remaining Longevity: 90 mo
Battery Remaining Percentage: 100 %
Brady Statistic RA Percent Paced: 94 %
Brady Statistic RV Percent Paced: 1 %
Date Time Interrogation Session: 20251107010000
Lead Channel Impedance Value: 474 Ohm
Lead Channel Impedance Value: 985 Ohm
Lead Channel Pacing Threshold Amplitude: 0.9 V
Lead Channel Pacing Threshold Amplitude: 1 V
Lead Channel Pacing Threshold Pulse Width: 0.4 ms
Lead Channel Pacing Threshold Pulse Width: 0.4 ms
Lead Channel Setting Pacing Amplitude: 2 V
Lead Channel Setting Pacing Amplitude: 2.4 V
Lead Channel Setting Pacing Pulse Width: 0.4 ms
Lead Channel Setting Sensing Sensitivity: 2.5 mV
Pulse Gen Serial Number: 776986
Zone Setting Status: 755011

## 2023-12-21 NOTE — Progress Notes (Signed)
 Remote PPM Transmission
